# Patient Record
Sex: Male | Born: 1965 | Race: Black or African American | Hispanic: No | Marital: Single | Smoking: Never smoker
Health system: Southern US, Community
[De-identification: ages and names within clinical notes are randomized; demographics above are authoritative.]

## PROBLEM LIST (undated history)

## (undated) DIAGNOSIS — K219 Gastro-esophageal reflux disease without esophagitis: Secondary | ICD-10-CM

## (undated) DIAGNOSIS — E785 Hyperlipidemia, unspecified: Secondary | ICD-10-CM

## (undated) DIAGNOSIS — R51 Headache: Secondary | ICD-10-CM

## (undated) DIAGNOSIS — M1990 Unspecified osteoarthritis, unspecified site: Secondary | ICD-10-CM

## (undated) DIAGNOSIS — R109 Unspecified abdominal pain: Secondary | ICD-10-CM

## (undated) DIAGNOSIS — K5900 Constipation, unspecified: Secondary | ICD-10-CM

## (undated) HISTORY — DX: Unspecified osteoarthritis, unspecified site: M19.90

## (undated) HISTORY — DX: Headache: R51

## (undated) HISTORY — DX: Hyperlipidemia, unspecified: E78.5

## (undated) HISTORY — DX: Unspecified abdominal pain: R10.9

---

## 2003-08-18 ENCOUNTER — Emergency Department (HOSPITAL_COMMUNITY): Admission: EM | Admit: 2003-08-18 | Discharge: 2003-08-18 | Payer: Self-pay | Admitting: Emergency Medicine

## 2003-08-19 ENCOUNTER — Emergency Department (HOSPITAL_COMMUNITY): Admission: EM | Admit: 2003-08-19 | Discharge: 2003-08-19 | Payer: Self-pay | Admitting: Emergency Medicine

## 2003-08-20 ENCOUNTER — Emergency Department (HOSPITAL_COMMUNITY): Admission: EM | Admit: 2003-08-20 | Discharge: 2003-08-20 | Payer: Self-pay | Admitting: Emergency Medicine

## 2003-08-21 ENCOUNTER — Emergency Department (HOSPITAL_COMMUNITY): Admission: EM | Admit: 2003-08-21 | Discharge: 2003-08-21 | Payer: Self-pay | Admitting: Emergency Medicine

## 2011-09-14 LAB — BASIC METABOLIC PANEL
ALT: 19 U/L (ref 10–40)
AST: 26 U/L
Albumin: 4.4
Alkaline Phosphatase: 61 U/L
BUN: 15 mg/dL (ref 4–21)
Total Bilirubin: 0.7 mg/dL

## 2011-09-14 LAB — CBC WITH DIFFERENTIAL/PLATELET
HCT: 41 %
Hemoglobin: 13.2 g/dL — AB (ref 13.5–17.5)
MCV: 89.4 fL
WBC: 5.8
platelet count: 199

## 2011-10-03 ENCOUNTER — Other Ambulatory Visit (HOSPITAL_COMMUNITY): Payer: Self-pay | Admitting: Pulmonary Disease

## 2011-10-05 ENCOUNTER — Ambulatory Visit (HOSPITAL_COMMUNITY)
Admission: RE | Admit: 2011-10-05 | Discharge: 2011-10-05 | Disposition: A | Discharge status: Home / Self Care | Payer: Managed Care, Other (non HMO) | Source: Ambulatory Visit | Attending: Pulmonary Disease | Admitting: Pulmonary Disease

## 2011-10-05 DIAGNOSIS — K5900 Constipation, unspecified: Secondary | ICD-10-CM | POA: Insufficient documentation

## 2011-10-05 DIAGNOSIS — R109 Unspecified abdominal pain: Secondary | ICD-10-CM | POA: Insufficient documentation

## 2011-10-06 ENCOUNTER — Other Ambulatory Visit (HOSPITAL_COMMUNITY): Payer: Self-pay

## 2012-01-30 ENCOUNTER — Encounter: Payer: Self-pay | Admitting: Gastroenterology

## 2012-01-31 ENCOUNTER — Encounter: Payer: Self-pay | Admitting: Gastroenterology

## 2012-01-31 ENCOUNTER — Ambulatory Visit (INDEPENDENT_AMBULATORY_CARE_PROVIDER_SITE_OTHER): Payer: Managed Care, Other (non HMO) | Admitting: Gastroenterology

## 2012-01-31 VITALS — BP 139/72 | HR 65 | Temp 98.0°F | Ht 75.0 in | Wt 173.0 lb

## 2012-01-31 DIAGNOSIS — R194 Change in bowel habit: Secondary | ICD-10-CM

## 2012-01-31 DIAGNOSIS — R198 Other specified symptoms and signs involving the digestive system and abdomen: Secondary | ICD-10-CM

## 2012-01-31 DIAGNOSIS — R1013 Epigastric pain: Secondary | ICD-10-CM | POA: Insufficient documentation

## 2012-01-31 DIAGNOSIS — R1310 Dysphagia, unspecified: Secondary | ICD-10-CM

## 2012-01-31 DIAGNOSIS — R109 Unspecified abdominal pain: Secondary | ICD-10-CM

## 2012-01-31 MED ORDER — DEXLANSOPRAZOLE 60 MG PO CPDR: 60.0000 mg | DELAYED_RELEASE_CAPSULE | Freq: Every day | ORAL | Status: DC

## 2012-01-31 MED ORDER — POLYETHYLENE GLYCOL 3350 17 GM/SCOOP PO POWD: 17.0000 g | Freq: Every day | ORAL | Status: AC | PRN

## 2012-01-31 NOTE — Assessment & Plan Note (Signed)
Change in bowel habits over past several months. Increased constipation. Also with left-sided abdominal pain/epigastric pain, unrelated/unaffected by BMs. C/O epigastric fullness, nausea. Given persistent symptoms, I recommend colonoscopy/egd for further evaluation. He has had reassuring labs, negative H.pylori serologies. EGD/TCS with Dr. Darrick Penna.  I have discussed the risks, alternatives, benefits with regards to but not limited to the risk of reaction to medication, bleeding, infection, perforation and the patient is agreeable to proceed. Written consent to be obtained.  Start Dexilant 60mg  daily. Start Miralax 17g daily prn.

## 2012-01-31 NOTE — Progress Notes (Signed)
Primary Care Physician:  Fredirick Maudlin, MD  Primary Gastroenterologist:  Jonette Eva, MD   Chief Complaint  Patient presents with  . Abdominal Pain  . Constipation  . Nausea    HPI:  Charles [name] is a 46 y.o. male here several month h/o of abdominal pain. Initially started out as constipation, tried fiber with temporary relief. Stomach virus 09/2011, diarrhea, abdominal cramps. Since then, daily abdominal pain. Left sided abdominal pain. Dull pain followed by sharp epigastric pain. No better after BM. Appetite ok, feels full in epigastric/nausea. No heartburn. Long time ago, heartburn. Tried Prevacid 24hr recently, helped for awhile but stopped working. BM used to go 4-5 times per week, now about twice per week. Stools hard in beginning. No brbpr, melena. No unintentional weight loss. No prior EGD/TCS. Denies regular etoh use. No NSAIDS/ASA.   No current outpatient prescriptions on file.    Allergies as of 01/31/2012  . (No Known Allergies)    Past Medical History  Diagnosis Date  . Abdominal pain   . Headache   . Hyperlipidemia     History reviewed. No pertinent past surgical history.  Family History  Problem Relation Age of Onset  . Colon cancer Neg Hx   . Inflammatory bowel disease Neg Hx   . Liver disease Neg Hx     History   Social History  . Marital Status: Single    Spouse Name: N/A    Number of Children: 4  . Years of Education: N/A   Occupational History  . [employer]     Social History Main Topics  . Smoking status: Never Smoker   . Smokeless tobacco: Not on file  . Alcohol Use: Yes     vodka, twice per week, 1 cup in two mixed drinks.   . Drug Use: No  . Sexually Active: Not on file   Other Topics Concern  . Not on file   Social History Narrative  . No narrative on file      ROS:  General: Negative for anorexia, weight loss, fever, chills, fatigue, weakness. Eyes: Negative for vision changes.  ENT: Negative for  hoarseness, difficulty swallowing , nasal congestion. CV: Negative for chest pain, angina, palpitations, dyspnea on exertion, peripheral edema.  Respiratory: Negative for dyspnea at rest, dyspnea on exertion, cough, sputum, wheezing.  GI: See history of present illness. GU:  Negative for dysuria, hematuria, urinary incontinence, urinary frequency, nocturnal urination.  MS: Negative for joint pain, low back pain.  Derm: Negative for rash or itching.  Neuro: Negative for weakness, abnormal sensation, seizure, frequent headaches, memory loss, confusion.  Psych: Negative for anxiety, depression, suicidal ideation, hallucinations.  Endo: Negative for unusual weight change.  Heme: Negative for bruising or bleeding. Allergy: Negative for rash or hives.    Physical Examination:  BP 139/72  Pulse 65  Temp 98 F (36.7 C) (Temporal)  Ht 6\' 3"  (1.905 m)  Wt 173 lb (78.472 kg)  BMI 21.62 kg/m2   General: Well-nourished, well-developed in no acute distress.  Head: Normocephalic, atraumatic.   Eyes: Conjunctiva pink, no icterus. Mouth: Oropharyngeal mucosa moist and pink , no lesions erythema or exudate. Neck: Supple without thyromegaly, masses, or lymphadenopathy.  Lungs: Clear to auscultation bilaterally.  Heart: Regular rate and rhythm, no murmurs rubs or gallops.  Abdomen: Bowel sounds are normal, mild epigastric and left mid abdominal tenderness. Nondistended, no hepatosplenomegaly or masses, no abdominal bruits or    hernia , no rebound or guarding.  Rectal: not performed Extremities: No lower extremity edema. No clubbing or deformities.  Neuro: Alert and oriented x 4 , grossly normal neurologically.  Skin: Warm and dry, no rash or jaundice.   Psych: Alert and cooperative, normal mood and affect.  Labs: Labs from 09/14/2011. White blood cell count 5.8, hemoglobin 13.2, hematocrit 40.5, platelets 199,000, sodium 142, potassium 4.8, BUN 15, creatinine 1.05, calcium 8.5, total bilirubin  0.7, alkaline phosphatase 61, AST 26, ALT 19, albumin 4.4, H. Pylori antibody IgA 1.5, negative.  Imaging Studies:   09/2011: Abd u/s:  IMPRESSION:  1. No gallstones are noted within gallbladder. Normal CBD.  2. Right kidney shows punctate echogenic foci which may represent  vascular calcification or a tiny calcified calculi. No  hydronephrosis.  3. No aortic aneurysm.

## 2012-01-31 NOTE — Patient Instructions (Addendum)
We have scheduled you for a colonoscopy and upper endoscopy with Dr. Darrick Penna. See separate instructions.   Start Dexilant 60mg  daily for abdominal pain. Samples and rebate card provided. If helps you can get prescription filled at your pharmacy.   Start Miralax for constipation. Take one capful daily as needed.

## 2012-01-31 NOTE — Progress Notes (Signed)
Faxed to PCP

## 2012-02-05 NOTE — Progress Notes (Signed)
Quick Note:  Charles [name], can you correct this abstract. Should be H.Pylori IgA is 1.5 NOT HEMOGLOBIN A1C. Thanks.   ______

## 2012-02-26 ENCOUNTER — Other Ambulatory Visit: Payer: Self-pay | Admitting: Gastroenterology

## 2012-02-26 MED ORDER — PEG-KCL-NACL-NASULF-NA ASC-C 100 G PO SOLR: 1.0000 | ORAL | Status: DC

## 2012-02-27 ENCOUNTER — Encounter (HOSPITAL_COMMUNITY): Payer: Self-pay | Admitting: Pharmacy Technician

## 2012-03-04 ENCOUNTER — Other Ambulatory Visit: Payer: Self-pay | Admitting: Gastroenterology

## 2012-03-04 DIAGNOSIS — R1310 Dysphagia, unspecified: Secondary | ICD-10-CM

## 2012-03-04 DIAGNOSIS — R194 Change in bowel habit: Secondary | ICD-10-CM

## 2012-03-04 DIAGNOSIS — R109 Unspecified abdominal pain: Secondary | ICD-10-CM

## 2012-03-04 DIAGNOSIS — R1013 Epigastric pain: Secondary | ICD-10-CM

## 2012-03-04 MED ORDER — SODIUM CHLORIDE 0.45 % IV SOLN
INTRAVENOUS | Status: DC
  Administered 2012-03-05: 1000 mL via INTRAVENOUS

## 2012-03-05 ENCOUNTER — Ambulatory Visit (HOSPITAL_COMMUNITY)
Admission: RE | Admit: 2012-03-05 | Discharge: 2012-03-05 | Disposition: A | Discharge status: Home / Self Care | Payer: Managed Care, Other (non HMO) | Source: Ambulatory Visit | Attending: Gastroenterology | Admitting: Gastroenterology

## 2012-03-05 ENCOUNTER — Encounter (HOSPITAL_COMMUNITY)
Admission: RE | Disposition: A | Discharge status: Home / Self Care | Payer: Self-pay | Source: Ambulatory Visit | Attending: Gastroenterology

## 2012-03-05 ENCOUNTER — Encounter (HOSPITAL_COMMUNITY): Payer: Self-pay | Admitting: *Deleted

## 2012-03-05 DIAGNOSIS — D126 Benign neoplasm of colon, unspecified: Secondary | ICD-10-CM

## 2012-03-05 DIAGNOSIS — K2970 Gastritis, unspecified, without bleeding: Secondary | ICD-10-CM

## 2012-03-05 DIAGNOSIS — R109 Unspecified abdominal pain: Secondary | ICD-10-CM

## 2012-03-05 DIAGNOSIS — K319 Disease of stomach and duodenum, unspecified: Secondary | ICD-10-CM | POA: Insufficient documentation

## 2012-03-05 DIAGNOSIS — K2990 Gastroduodenitis, unspecified, without bleeding: Secondary | ICD-10-CM

## 2012-03-05 DIAGNOSIS — K3189 Other diseases of stomach and duodenum: Secondary | ICD-10-CM | POA: Insufficient documentation

## 2012-03-05 DIAGNOSIS — K620 Anal polyp: Secondary | ICD-10-CM

## 2012-03-05 DIAGNOSIS — R1310 Dysphagia, unspecified: Secondary | ICD-10-CM | POA: Insufficient documentation

## 2012-03-05 DIAGNOSIS — R1013 Epigastric pain: Secondary | ICD-10-CM

## 2012-03-05 DIAGNOSIS — R198 Other specified symptoms and signs involving the digestive system and abdomen: Secondary | ICD-10-CM

## 2012-03-05 DIAGNOSIS — R194 Change in bowel habit: Secondary | ICD-10-CM

## 2012-03-05 DIAGNOSIS — D128 Benign neoplasm of rectum: Secondary | ICD-10-CM | POA: Insufficient documentation

## 2012-03-05 DIAGNOSIS — K621 Rectal polyp: Secondary | ICD-10-CM

## 2012-03-05 DIAGNOSIS — K648 Other hemorrhoids: Secondary | ICD-10-CM | POA: Insufficient documentation

## 2012-03-05 HISTORY — PX: ESOPHAGOGASTRODUODENOSCOPY: SHX1529

## 2012-03-05 HISTORY — DX: Constipation, unspecified: K59.00

## 2012-03-05 HISTORY — DX: Gastro-esophageal reflux disease without esophagitis: K21.9

## 2012-03-05 HISTORY — PX: COLONOSCOPY: SHX174

## 2012-03-05 SURGERY — COLONOSCOPY WITH ESOPHAGOGASTRODUODENOSCOPY (EGD)
Anesthesia: Moderate Sedation

## 2012-03-05 MED ORDER — MINERAL OIL PO OIL
TOPICAL_OIL | ORAL | Status: AC
  Filled 2012-03-05: qty 30

## 2012-03-05 MED ORDER — MEPERIDINE HCL 100 MG/ML IJ SOLN
INTRAMUSCULAR | Status: AC
  Filled 2012-03-05: qty 2

## 2012-03-05 MED ORDER — MIDAZOLAM HCL 5 MG/5ML IJ SOLN
INTRAMUSCULAR | Status: DC | PRN
  Administered 2012-03-05 (×2): 2 mg via INTRAVENOUS
  Administered 2012-03-05: 1 mg via INTRAVENOUS
  Administered 2012-03-05: 2 mg via INTRAVENOUS

## 2012-03-05 MED ORDER — MIDAZOLAM HCL 5 MG/5ML IJ SOLN
INTRAMUSCULAR | Status: AC
  Filled 2012-03-05: qty 10

## 2012-03-05 MED ORDER — BUTAMBEN-TETRACAINE-BENZOCAINE 2-2-14 % EX AERO
INHALATION_SPRAY | CUTANEOUS | Status: DC | PRN
  Administered 2012-03-05: 2 via TOPICAL

## 2012-03-05 MED ORDER — MEPERIDINE HCL 100 MG/ML IJ SOLN
INTRAMUSCULAR | Status: DC | PRN
  Administered 2012-03-05: 25 mg via INTRAVENOUS
  Administered 2012-03-05: 50 mg via INTRAVENOUS
  Administered 2012-03-05: 25 mg via INTRAVENOUS

## 2012-03-05 NOTE — H&P (Signed)
  Primary Care Physician:  Fredirick Maudlin, MD Primary Gastroenterologist:  Dr. Darrick Penna  Pre-Procedure History & Physical: HPI:  Charles [name] is a 46 y.o. male here for CHANGE IN BOWEL HABITS/dysphagia.   Past Medical History  Diagnosis Date  . Abdominal pain   . Headache   . Hyperlipidemia   . Constipation   . GERD (gastroesophageal reflux disease)     History reviewed. No pertinent past surgical history.  Prior to Admission medications   Medication Sig Start Date End Date Taking? Authorizing Provider  dexlansoprazole (DEXILANT) 60 MG capsule Take 1 capsule (60 mg total) by mouth daily. 01/31/12 04/04/12 Yes Tiffany Kocher, PA  polyethylene glycol (MIRALAX / GLYCOLAX) packet Take 17 g by mouth daily.   Yes Historical Provider, MD    Allergies as of 01/31/2012  . (No Known Allergies)    Family History  Problem Relation Age of Onset  . Colon cancer Neg Hx   . Inflammatory bowel disease Neg Hx   . Liver disease Neg Hx     History   Social History  . Marital Status: Single    Spouse Name: N/A    Number of Children: 4  . Years of Education: N/A   Occupational History  . [employer]     Social History Main Topics  . Smoking status: Never Smoker   . Smokeless tobacco: Not on file  . Alcohol Use: 2.5 oz/week    5 drink(s) per week     vodka, twice per week, 1 cup in two mixed drinks.   . Drug Use: No  . Sexually Active: Not on file   Other Topics Concern  . Not on file   Social History Narrative  . No narrative on file    Review of Systems: See HPI, otherwise negative ROS   Physical Exam: BP 144/91  Pulse 69  Temp 98.3 F (36.8 C) (Oral)  Resp 18  Ht 6\' 3"  (1.905 m)  Wt 173 lb (78.472 kg)  BMI 21.62 kg/m2  SpO2 99% General:   Alert,  pleasant and cooperative in NAD Head:  Normocephalic and atraumatic. Neck:  Supple; Lungs:  Clear throughout to auscultation.    Heart:  Regular rate and rhythm. Abdomen:  Soft, nontender and  nondistended. Normal bowel sounds, without guarding, and without rebound.   Neurologic:  Alert and  oriented x4;  grossly normal neurologically.  Impression/Plan:     Change in bowel habits/DYSPHAGIA.   PLAN:  1. TCS/egd/dil TODAY

## 2012-03-05 NOTE — Op Note (Signed)
[facility] [address] [place], [address]   ENDOSCOPY PROCEDURE REPORT  PATIENT: Charles, [name]  MR#: [id number] BIRTHDATE: 1965/12/01 , 46  yrs. old GENDER: Male  ENDOSCOPIST: Jonette Eva, MD REFFERED XB:JYNWGN Juanetta Gosling, M.D.  PROCEDURE DATE:  03/05/2012 PROCEDURE:   EGD with biopsy and EGD with dilatation over guidewire   INDICATIONS:1.  dysphagia.   2.  dyspepsia. MEDICATIONS: TCS+ Versed 1mg  IV TOPICAL ANESTHETIC: Cetacaine Spray  DESCRIPTION OF PROCEDURE:   After the risks benefits and alternatives of the procedure were thoroughly explained, informed consent was obtained.  The EG-[id number] ([id number])  endoscope was introduced through the mouth and advanced to the second portion of the duodenum.  The instrument was slowly withdrawn as the mucosa was carefully examined.  Prior to withdrawal of the scope, the guidwire was placed.  The esophagus was dilated successfully.  The patient was recovered in endoscopy and discharged home in satisfactory condition.      ESOPHAGUS: The mucosa of the esophagus appeared normal.   The mucosa of the esophagus appeared normal.  STOMACH: Non-erosive gastritis (inflammation) was found.  Multiple biopsies were performed using cold forceps.   Dilation was then performed at the gastroesphageal junction  Dilator: Savary over guidewire Size(s): 12.8-16 MM Resistance: minimal  COMPLICATIONS: There were no complications.   ENDOSCOPIC IMPRESSION: 1.   The mucosa of the esophagus appeared normal 2.   The mucosa of the esophagus appeared normal 3.   Non-erosive gastritis (inflammation) was found; multiple biopsies  RECOMMENDATIONS: DRINK WATER TO KEEP URINE LIGHT YELLOW.  MIRALAX EVERY DAY.  CONTINUE DEXILANT EVERY MORNING.  FOLLOW A HIGH FIBER/LOW FAT DIET.  AVOID ITEMS THAT CAUSE BLOATING.   BIOPSY WILL BE BACK IN 7 DAYS.  Follow up in 3 mos.      _______________________________ Gwendlyn Deutscher,  MD 03/05/2012 4:49 PM      PATIENT NAME:  Charles, [name] MR#: [id number]

## 2012-03-05 NOTE — Op Note (Signed)
[facility] [address] [place], [address]   COLONOSCOPY PROCEDURE REPORT  PATIENT: Charles Charles [name] Charles [name], Charles Charles [name] Charles [name]  MR#: [id number] BIRTHDATE: [birth year] , 46  yrs. old GENDER: Male ENDOSCOPIST: Jonette Eva, MD REFERRED XB:JYNWGN Juanetta Gosling, M.D. PROCEDURE DATE:  03/05/2012 PROCEDURE:   Colonoscopy with biopsy INDICATIONS:change in bowel habits. MEDICATIONS: Demerol 100 mg IV and Versed 6 mg IV  DESCRIPTION OF PROCEDURE:    Physical exam was performed.  Informed consent was obtained from the patient after explaining the benefits, risks, and alternatives to procedure.  The patient was connected to monitor and placed in left lateral position. Continuous oxygen was provided by nasal cannula and IV medicine administered through an indwelling cannula.  After administration of sedation and rectal exam, the patients rectum was intubated and the [id number] ([id number])  colonoscope was advanced under direct visualization to the cecum.  The scope was removed slowly by carefully examining the color, texture, anatomy, and integrity mucosa on the way out.  The patient was recovered in endoscopy and discharged home in satisfactory condition.       COLON FINDINGS: Two sessile polyps were found at the cecum and in the rectum.  A polypectomy was performed with cold forceps.  , The colon was otherwise normal.  There was no diverticulosis, inflammation, polyps or cancers unless previously stated.  , Small internal hemorrhoids were found.  , and The mucosa appeared normal in the terminal ileum.  PREP QUALITY: good. CECAL W/D TIME: 18 minutes  COMPLICATIONS: None  ENDOSCOPIC IMPRESSION: 1.   Two sessile polyps were found at the cecum and in the rectum; polypectomy was performed with cold forceps 2.   The colon was otherwise normal 3.   Small internal hemorrhoids 4.   Normal mucosa in the terminal ileum   RECOMMENDATIONS: DRINK WATER TO KEEP URINE LIGHT YELLOW.  MIRALAX EVERY  DAY.  FOLLOW A HIGH FIBER/LOW FAT DIET.  AVOID ITEMS THAT CAUSE BLOATING. SEE INFO BELOW.  BIOPSY WILL BE BACK IN 7 DAYS.  Next colonoscopy in 10 years.       _______________________________ Charles DoctorJonette Eva, MD 03/05/2012 4:46 PM     PATIENT NAME:  Charles Charles [name], Charles [name] MR#: [id number]

## 2012-03-07 ENCOUNTER — Telehealth: Payer: Self-pay | Admitting: Gastroenterology

## 2012-03-07 NOTE — Telephone Encounter (Signed)
LMOM to call.

## 2012-03-07 NOTE — Telephone Encounter (Signed)
Faxed to PCP, recall made  

## 2012-03-07 NOTE — Telephone Encounter (Signed)
PLEASE CALL PT.  He had HYPERPLASTIC POLYPS removed from her colon. His stomach Bx shows gastritis. Continue Dexilant. Miralax daily. Follow a low fat/high fiber diet. OPV IN 3 mos E30 GERD, CONSTIPATION. Next TCS in 10 years.

## 2012-03-08 NOTE — Telephone Encounter (Signed)
Pt is aware of results. 

## 2012-03-14 NOTE — Progress Notes (Signed)
TCS OCT 2013 hyperplastic polyps  EGD OCT 2013 GASTRITIS  REVIEWED.

## 2012-05-10 ENCOUNTER — Encounter: Payer: Self-pay | Admitting: *Deleted

## 2012-05-16 ENCOUNTER — Other Ambulatory Visit: Payer: Self-pay

## 2012-05-16 MED ORDER — DEXLANSOPRAZOLE 60 MG PO CPDR: 60.0000 mg | DELAYED_RELEASE_CAPSULE | Freq: Every day | ORAL | Status: DC

## 2012-09-04 ENCOUNTER — Telehealth: Payer: Self-pay

## 2012-09-04 MED ORDER — OMEPRAZOLE 20 MG PO CPDR: 20.0000 mg | DELAYED_RELEASE_CAPSULE | Freq: Every day | ORAL | Status: DC

## 2012-09-04 NOTE — Telephone Encounter (Signed)
Received PA request from pharmacy for Dexilant. Per [employer] formulary, pt will need to try omeprazole and pantoprazole. Pt has only tried prevacid.

## 2012-09-04 NOTE — Telephone Encounter (Signed)
Tried to call pt- Charles [name] with details. Asked him to call back if he had any questions.

## 2012-09-04 NOTE — Telephone Encounter (Signed)
Prilosec sent to pharmacy.  Contact us in 1 month with PR>.

## 2012-09-10 ENCOUNTER — Telehealth: Payer: Self-pay | Admitting: Gastroenterology

## 2012-09-10 MED ORDER — PANTOPRAZOLE SODIUM 40 MG PO TBEC: 40.0000 mg | DELAYED_RELEASE_TABLET | Freq: Every day | ORAL | Status: DC

## 2012-09-10 NOTE — Addendum Note (Signed)
Addended by: Tiffany Kocher on: 09/10/2012 01:09 PM   Modules accepted: Orders, Medications

## 2012-09-10 NOTE — Telephone Encounter (Signed)
Patient called back and I told him new Rx has been sent to the pharmacy and he made a f/u appointment for 09/26/12

## 2012-09-10 NOTE — Telephone Encounter (Signed)
He can try pantoprazole. Stop omeprazole since he likely has side effects.  RX sent to pharmacy. Schedule f/u ov as per previously recommended

## 2012-09-10 NOTE — Telephone Encounter (Signed)
Patient called to speak with nurse regarding side effects of medication. [phone]

## 2012-09-10 NOTE — Telephone Encounter (Signed)
Called. Many rings and no answer.  

## 2012-09-10 NOTE — Telephone Encounter (Signed)
I called pt. He said the next day after he started taking the Omeprazole he got diarrhea and nausea. It has continued and he said his stools also look dark. I told him to stop until I call him back. He forgot about his follow up appt and is not scheduled for recheck. Please advise!

## 2012-09-24 ENCOUNTER — Encounter: Payer: Self-pay | Admitting: Gastroenterology

## 2012-09-26 ENCOUNTER — Ambulatory Visit: Payer: Managed Care, Other (non HMO) | Admitting: Gastroenterology

## 2012-10-09 ENCOUNTER — Ambulatory Visit (INDEPENDENT_AMBULATORY_CARE_PROVIDER_SITE_OTHER): Payer: Managed Care, Other (non HMO) | Admitting: Gastroenterology

## 2012-10-09 ENCOUNTER — Encounter: Payer: Self-pay | Admitting: Gastroenterology

## 2012-10-09 VITALS — BP 130/78 | HR 76 | Temp 97.4°F | Ht 74.0 in | Wt 178.8 lb

## 2012-10-09 DIAGNOSIS — R1013 Epigastric pain: Secondary | ICD-10-CM

## 2012-10-09 MED ORDER — DEXLANSOPRAZOLE 60 MG PO CPDR: 60.0000 mg | DELAYED_RELEASE_CAPSULE | Freq: Every day | ORAL | Status: DC

## 2012-10-09 MED ORDER — POLYETHYLENE GLYCOL 3350 17 GM/SCOOP PO POWD: 17.0000 g | Freq: Every day | ORAL | Status: DC

## 2012-10-09 NOTE — Patient Instructions (Addendum)
Restart Dexilant, we have provided samples. We will need to approve this with your insurance company.   Return in 6 months. If your symptoms continue, please call us and we will further evaluate your gallbladder with a HIDA scan.

## 2012-10-09 NOTE — Assessment & Plan Note (Addendum)
47 year old male s/p EGD and colonoscopy as described above. Significantly improved symptoms with Dexilant; he has tried and failed both Prilosec and Protonix. I have resent Dexilant prescription, with most likely a prior authorization required. Samples provided in the interim. I have asked him to call if no improvement, as we may need to rule out underlying biliary dyskinesia by pursuing a HIDA scan. This is less likely. Constipation rare, and he does well with Miralax prn. This has also been sent to the pharmacy.  Dexilant daily Miralax prn: declined prescription medication such as Linzess Return in 6 mos HIDA if no improvement in symptoms Next TCS in 2023

## 2012-10-09 NOTE — Progress Notes (Signed)
   Referring Provider: Fredirick Maudlin, MD Primary Care Physician:  Fredirick Maudlin, MD Primary GI: Dr. Darrick Penna   Chief Complaint  Patient presents with  . Follow-up    HPI:   47 year old male presenting today in follow-up after EGD and colonoscopy by Dr. Darrick Penna. Gastritis with empiric Savary dilation performed, negative h.pylori. Colonoscopy with hyperplastic polyps.   Was on Dexilant, due to insurance had to try preferred PPIs firs. Changed to Omeprazole and then Protonix. Dexilant did well with abdominal discomfort. Both Omeprazole and Protonix caused abdominal cramps, N/V, migraines. Stopped Protonix early May. Has upper abdominal, above umbilical area abdominal discomfort occasionally. Wonders if related to eating, certain types of foods. Intermittent. "not bad". Occasional nausea. Like a dull discomfort. Occasional constipation. Was taking Miralax prn.   Past Medical History  Diagnosis Date  . Abdominal pain   . Headache   . Hyperlipidemia   . Constipation   . GERD (gastroesophageal reflux disease)     Past Surgical History  Procedure Laterality Date  . Colonoscopy  03/05/2012    ZOX:WRUEA internal hemorrhoids/Two sessile polyps HYPERPLASTIC  . Esophagogastroduodenoscopy  03/05/2012    SLF:Non-erosive gastritis (inflammation), negative H.pylori, s/p empiric Savary dilation    Current Outpatient Prescriptions  Medication Sig Dispense Refill  . dexlansoprazole (DEXILANT) 60 MG capsule Take 1 capsule (60 mg total) by mouth daily.  30 capsule  3  . polyethylene glycol powder (MIRALAX) powder Take 17 g by mouth daily.  255 g  5   No current facility-administered medications for this visit.    Allergies as of 10/09/2012  . (No Known Allergies)    Family History  Problem Relation Age of Onset  . Colon cancer Neg Hx   . Inflammatory bowel disease Neg Hx   . Liver disease Neg Hx     History   Social History  . Marital Status: Single    Spouse Name: N/A    Number  of Children: 4  . Years of Education: N/A   Occupational History  . [employer]     Social History Main Topics  . Smoking status: Never Smoker   . Smokeless tobacco: None  . Alcohol Use: 2.5 oz/week    5 drink(s) per week     Comment: vodka, twice per week, 1 cup in two mixed drinks.   . Drug Use: No  . Sexually Active: None   Other Topics Concern  . None   Social History Narrative  . None    Review of Systems: Negative unless noted in HPI.  Physical Exam: BP 130/78  Pulse 76  Temp(Src) 97.4 F (36.3 C) (Oral)  Ht 6\' 2"  (1.88 m)  Wt 178 lb 12.8 oz (81.103 kg)  BMI 22.95 kg/m2 General:   Alert and oriented. No distress noted. Pleasant and cooperative.  Head:  Normocephalic and atraumatic. Eyes:  Conjuctiva clear without scleral icterus. Heart:  S1, S2 present without murmurs, rubs, or gallops. Regular rate and rhythm. Abdomen:  +BS, soft, non-tender and non-distended. No rebound or guarding. No HSM or masses noted. Msk:  Symmetrical without gross deformities. Normal posture. Extremities:  Without edema. Neurologic:  Alert and  oriented x4;  grossly normal neurologically. Skin:  Intact without significant lesions or rashes. Psych:  Alert and cooperative. Normal mood and affect.

## 2012-10-09 NOTE — Progress Notes (Signed)
Cc PCP 

## 2012-10-18 ENCOUNTER — Other Ambulatory Visit: Payer: Self-pay

## 2012-10-18 MED ORDER — DEXLANSOPRAZOLE 60 MG PO CPDR: 60.0000 mg | DELAYED_RELEASE_CAPSULE | Freq: Every day | ORAL | Status: DC

## 2012-10-30 ENCOUNTER — Other Ambulatory Visit (HOSPITAL_COMMUNITY): Payer: Self-pay | Admitting: Pulmonary Disease

## 2012-10-30 DIAGNOSIS — R109 Unspecified abdominal pain: Secondary | ICD-10-CM

## 2012-11-07 ENCOUNTER — Encounter (HOSPITAL_COMMUNITY): Payer: Self-pay

## 2012-11-07 ENCOUNTER — Encounter (HOSPITAL_COMMUNITY)
Admission: RE | Admit: 2012-11-07 | Discharge: 2012-11-07 | Disposition: A | Discharge status: Home / Self Care | Payer: Managed Care, Other (non HMO) | Source: Ambulatory Visit | Attending: Pulmonary Disease | Admitting: Pulmonary Disease

## 2012-11-07 DIAGNOSIS — R109 Unspecified abdominal pain: Secondary | ICD-10-CM | POA: Insufficient documentation

## 2012-11-07 MED ORDER — TECHNETIUM TC 99M MEBROFENIN IV KIT
5.0000 | PACK | Freq: Once | INTRAVENOUS | Status: AC | PRN
  Administered 2012-11-07: 5 via INTRAVENOUS

## 2012-11-07 MED ORDER — SINCALIDE 5 MCG IJ SOLR
0.0200 ug/kg | Freq: Once | INTRAMUSCULAR | Status: AC
  Administered 2012-11-07: 1.59 ug via INTRAVENOUS

## 2013-04-09 ENCOUNTER — Encounter: Payer: Self-pay | Admitting: Gastroenterology

## 2013-04-26 NOTE — Progress Notes (Signed)
REVIEWED.  

## 2013-05-06 NOTE — Patient Instructions (Signed)
Charles [name]  05/06/2013   Your procedure is scheduled on:  05/09/2013  Report to [facility] at  730  AM.  Call this number if you have problems the morning of surgery: [phone]   Remember:   Do not eat food or drink liquids after midnight.   Take these medicines the morning of surgery with A SIP OF WATER:  dexilant   Do not wear jewelry, make-up or nail polish.  Do not wear lotions, powders, or perfumes.   Do not shave 48 hours prior to surgery. Men may shave face and neck.  Do not bring valuables to the hospital.  [facility] is not responsible for any belongings or valuables.               Contacts, dentures or bridgework may not be worn into surgery.  Leave suitcase in the car. After surgery it may be brought to your room.  For patients admitted to the hospital, discharge time is determined by your treatment team.               Patients discharged the day of surgery will not be allowed to drive home.  Name and phone number of your driver: family  Special Instructions: Shower using CHG 2 nights before surgery and the night before surgery.  If you shower the day of surgery use CHG.  Use special wash - you have one bottle of CHG for all showers.  You should use approximately 1/3 of the bottle for each shower.   Please read over the following fact sheets that you were given: Pain Booklet, Coughing and Deep Breathing, Surgical Site Infection Prevention, Anesthesia Post-op Instructions and Care and Recovery After Surgery Hernia A hernia occurs when an internal organ pushes out through a weak spot in the abdominal wall. Hernias most commonly occur in the groin and around the navel. Hernias often can be pushed back into place (reduced). Most hernias tend to get worse over time. Some abdominal hernias can get stuck in the opening (irreducible or incarcerated hernia) and cannot be reduced. An irreducible abdominal hernia which is tightly squeezed into the opening is at risk for  impaired blood supply (strangulated hernia). A strangulated hernia is a medical emergency. Because of the risk for an irreducible or strangulated hernia, surgery may be recommended to repair a hernia. CAUSES   Heavy lifting.  Prolonged coughing.  Straining to have a bowel movement.  A cut (incision) made during an abdominal surgery. HOME CARE INSTRUCTIONS   Bed rest is not required. You may continue your normal activities.  Avoid lifting more than 10 pounds (4.5 kg) or straining.  Cough gently. If you are a smoker it is best to stop. Even the best hernia repair can break down with the continual strain of coughing. Even if you do not have your hernia repaired, a cough will continue to aggravate the problem.  Do not wear anything tight over your hernia. Do not try to keep it in with an outside bandage or truss. These can damage abdominal contents if they are trapped within the hernia sac.  Eat a normal diet.  Avoid constipation. Straining over long periods of time will increase hernia size and encourage breakdown of repairs. If you cannot do this with diet alone, stool softeners may be used. SEEK IMMEDIATE MEDICAL CARE IF:   You have a fever.  You develop increasing abdominal pain.  You feel nauseous or vomit.  Your hernia is stuck  outside the abdomen, looks discolored, feels hard, or is tender.  You have any changes in your bowel habits or in the hernia that are unusual for you.  You have increased pain or swelling around the hernia.  You cannot push the hernia back in place by applying gentle pressure while lying down. MAKE SURE YOU:   Understand these instructions.  Will watch your condition.  Will get help right away if you are not doing well or get worse. Document Released: 05/15/2005 Document Revised: 08/07/2011 Document Reviewed: 01/02/2008 [facility] Patient Information 2014 [name] PATIENT INSTRUCTIONS POST-ANESTHESIA  IMMEDIATELY FOLLOWING SURGERY:  Do  not drive or operate machinery for the first twenty four hours after surgery.  Do not make any important decisions for twenty four hours after surgery or while taking narcotic pain medications or sedatives.  If you develop intractable nausea and vomiting or a severe headache please notify your doctor immediately.  FOLLOW-UP:  Please make an appointment with your surgeon as instructed. You do not need to follow up with anesthesia unless specifically instructed to do so.  WOUND CARE INSTRUCTIONS (if applicable):  Keep a dry clean dressing on the anesthesia/puncture wound site if there is drainage.  Once the wound has quit draining you may leave it open to air.  Generally you should leave the bandage intact for twenty four hours unless there is drainage.  If the epidural site drains for more than 36-48 hours please call the anesthesia department.  QUESTIONS?:  Please feel free to call your physician or the hospital operator if you have any questions, and they will be happy to assist you.

## 2013-05-06 NOTE — H&P (Signed)
NTS SOAP Note  Vital Signs:  Vitals as of: 05/05/2013: Systolic 147: Diastolic 99: Heart Rate 66: Temp 98.5F: Height 74ft 3in: Weight 178Lbs 0 Ounces: Pain Level 7: BMI 22.25  BMI : 22.25 kg/m2  Subjective: This 47 Years old Male presents for of  patient presents with right groin discomfort and intermittent bulging. He states he has noted this over the last month or so. It has slowly increased in size. In he states it is always gone in the morning but occurs after straining or after and in a standing position for while. No signs or symptoms of incarceration or strangulation on discussion. No similar symptomatology in the past.  Review of Symptoms:  Constitutional:unremarkable   Head:unremarkable    Eyes:unremarkable   Nose/Mouth/Throat:unremarkable Cardiovascular:  unremarkable   Respiratory:unremarkable        as per history of present illness Genitourinary:unremarkable     Musculoskeletal:unremarkable   Skin:unremarkable Breast:unremarkable   Hematolgic/Lymphatic:unremarkable     Allergic/Immunologic:unremarkable     Past Medical History:  Obtained     Past Medical History  Surgical History: none Medical Problems: none Allergies: no known drug allergies Medications: none   Social History:Obtained  Social History  Preferred Language: English Race:  Black or African American Ethnicity: Not Hispanic / Latino Age: 43 Years 11 Months Marital Status:  S Alcohol: occasional Recreational drug(s): none   Smoking Status: Never smoker reviewed on 11/27/2012 Functional Status reviewed on mm/dd/yyyy ------------------------------------------------ Bathing: Normal Cooking: Normal Dressing: Normal Driving: Normal Eating: Normal Managing Meds: Normal Oral Care: Normal Shopping: Normal Toileting: Normal Transferring: Normal Walking: Normal Cognitive Status reviewed on  mm/dd/yyyy ------------------------------------------------ Attention: Normal Decision Making: Normal Language: Normal Memory: Normal Motor: Normal Perception: Normal Problem Solving: Normal Visual and Spatial: Normal   Family History:Obtained    Family Health History Mother  Father  Other Family Member, Living; Healthy; diabetes mellitus    Objective Information: General:  Well appearing, well nourished in no distress. Skin:     no rash or prominent lesions Head:Atraumatic; no masses; no abnormalities Eyes:  conjunctiva clear, EOM intact, PERRL Mouth:  Mucous membranes moist, no mucosal lesions. Throat:  no erythema, exudates or lesions. Neck:  Supple without lymphadenopathy.  Heart:  RRR, no murmur Lungs:    CTA bilaterally, no wheezes, rhonchi, rales.  Breathing unlabored. Abdomen:Soft, NT/ND, no HSM, no masses.   Small umbilical hernia. Laxity noted in the left inguinal region, right inguinal hernia easily reducible.  Assessment:Right inguinal hernia    Plan:  Right inguinal hernia. Options were discussed with patient. Signs and symptoms of incarceration and strangulation were also discussed. Patient is aware to proceed to the emergency department should any signs or symptoms occur. At this time patient will consider his options. He does not wish to proceed with this formal surgical repair at this time but will call should he decide to proceed.  Patient Education:Alternative treatments to surgery were discussed with patient (and family).  Risks and benefits  of procedure were fully explained to the patient (and family) who gave informed consent. Patient/family questions were addressed.

## 2013-05-07 ENCOUNTER — Encounter (HOSPITAL_COMMUNITY)
Admission: RE | Admit: 2013-05-07 | Discharge: 2013-05-07 | Disposition: A | Discharge status: Home / Self Care | Payer: Managed Care, Other (non HMO) | Source: Ambulatory Visit | Attending: General Surgery | Admitting: General Surgery

## 2013-05-07 ENCOUNTER — Encounter (HOSPITAL_COMMUNITY): Payer: Self-pay

## 2013-05-07 ENCOUNTER — Encounter (HOSPITAL_COMMUNITY): Payer: Self-pay | Admitting: Pharmacy Technician

## 2013-05-07 LAB — CBC WITH DIFFERENTIAL/PLATELET
Eosinophils Relative: 3 % (ref 0–5)
HCT: 37.7 % — ABNORMAL LOW (ref 39.0–52.0)
Lymphocytes Relative: 30 % (ref 12–46)
Lymphs Abs: 1.1 10*3/uL (ref 0.7–4.0)
MCV: 87.7 fL (ref 78.0–100.0)
Monocytes Absolute: 0.4 10*3/uL (ref 0.1–1.0)
Monocytes Relative: 11 % (ref 3–12)
RBC: 4.3 MIL/uL (ref 4.22–5.81)
WBC: 3.8 10*3/uL — ABNORMAL LOW (ref 4.0–10.5)

## 2013-05-07 LAB — BASIC METABOLIC PANEL
CO2: 30 mEq/L (ref 19–32)
Chloride: 102 mEq/L (ref 96–112)
Sodium: 140 mEq/L (ref 135–145)

## 2013-05-08 ENCOUNTER — Encounter (HOSPITAL_COMMUNITY): Payer: Self-pay | Admitting: Pharmacy Technician

## 2013-05-09 ENCOUNTER — Encounter (HOSPITAL_COMMUNITY): Payer: Self-pay | Admitting: *Deleted

## 2013-05-09 ENCOUNTER — Ambulatory Visit (HOSPITAL_COMMUNITY)
Admission: RE | Admit: 2013-05-09 | Discharge: 2013-05-09 | Disposition: A | Discharge status: Home / Self Care | Payer: Managed Care, Other (non HMO) | Source: Ambulatory Visit | Attending: General Surgery | Admitting: General Surgery

## 2013-05-09 ENCOUNTER — Ambulatory Visit (HOSPITAL_COMMUNITY): Payer: Managed Care, Other (non HMO) | Admitting: Anesthesiology

## 2013-05-09 ENCOUNTER — Encounter (HOSPITAL_COMMUNITY)
Admission: RE | Disposition: A | Discharge status: Home / Self Care | Payer: Managed Care, Other (non HMO) | Source: Ambulatory Visit | Attending: General Surgery

## 2013-05-09 ENCOUNTER — Encounter (HOSPITAL_COMMUNITY): Payer: Managed Care, Other (non HMO) | Admitting: Anesthesiology

## 2013-05-09 DIAGNOSIS — K4090 Unilateral inguinal hernia, without obstruction or gangrene, not specified as recurrent: Secondary | ICD-10-CM | POA: Insufficient documentation

## 2013-05-09 HISTORY — PX: INGUINAL HERNIA REPAIR: SHX194

## 2013-05-09 SURGERY — REPAIR, HERNIA, INGUINAL, ADULT
Anesthesia: General | Site: Abdomen | Laterality: Right

## 2013-05-09 MED ORDER — BUPIVACAINE HCL (PF) 0.5 % IJ SOLN
INTRAMUSCULAR | Status: AC
  Filled 2013-05-09: qty 30

## 2013-05-09 MED ORDER — KETOROLAC TROMETHAMINE 30 MG/ML IJ SOLN
INTRAMUSCULAR | Status: AC
  Filled 2013-05-09: qty 1

## 2013-05-09 MED ORDER — ROCURONIUM BROMIDE 100 MG/10ML IV SOLN
INTRAVENOUS | Status: DC | PRN
  Administered 2013-05-09: 5 mg via INTRAVENOUS

## 2013-05-09 MED ORDER — SODIUM CHLORIDE 0.9 % IR SOLN
Status: DC | PRN
  Administered 2013-05-09: 1000 mL

## 2013-05-09 MED ORDER — FENTANYL CITRATE 0.05 MG/ML IJ SOLN
INTRAMUSCULAR | Status: AC
  Filled 2013-05-09: qty 2

## 2013-05-09 MED ORDER — FENTANYL CITRATE 0.05 MG/ML IJ SOLN
INTRAMUSCULAR | Status: AC
  Filled 2013-05-09: qty 5

## 2013-05-09 MED ORDER — FENTANYL CITRATE 0.05 MG/ML IJ SOLN: 25.0000 ug | INTRAMUSCULAR | Status: DC | PRN

## 2013-05-09 MED ORDER — OXYCODONE-ACETAMINOPHEN 7.5-325 MG PO TABS: 1.0000 | ORAL_TABLET | ORAL | Status: DC | PRN

## 2013-05-09 MED ORDER — EPHEDRINE SULFATE 50 MG/ML IJ SOLN
INTRAMUSCULAR | Status: DC | PRN
  Administered 2013-05-09: 10 mg via INTRAVENOUS

## 2013-05-09 MED ORDER — PROPOFOL 10 MG/ML IV EMUL
INTRAVENOUS | Status: AC
  Filled 2013-05-09: qty 20

## 2013-05-09 MED ORDER — GLYCOPYRROLATE 0.2 MG/ML IJ SOLN
INTRAMUSCULAR | Status: DC | PRN
  Administered 2013-05-09: .4 mg via INTRAVENOUS

## 2013-05-09 MED ORDER — LIDOCAINE HCL 1 % IJ SOLN
INTRAMUSCULAR | Status: DC | PRN
  Administered 2013-05-09: 30 mg via INTRADERMAL

## 2013-05-09 MED ORDER — BUPIVACAINE LIPOSOME 1.3 % IJ SUSP
INTRAMUSCULAR | Status: DC | PRN
  Administered 2013-05-09: 8 mL

## 2013-05-09 MED ORDER — MIDAZOLAM HCL 2 MG/2ML IJ SOLN
INTRAMUSCULAR | Status: AC
  Filled 2013-05-09: qty 2

## 2013-05-09 MED ORDER — CEFAZOLIN SODIUM-DEXTROSE 2-3 GM-% IV SOLR
2.0000 g | INTRAVENOUS | Status: AC
  Administered 2013-05-09: 2 g via INTRAVENOUS
  Filled 2013-05-09: qty 50

## 2013-05-09 MED ORDER — NEOSTIGMINE METHYLSULFATE 1 MG/ML IJ SOLN
INTRAMUSCULAR | Status: DC | PRN
  Administered 2013-05-09: 2 mg via INTRAVENOUS

## 2013-05-09 MED ORDER — LIDOCAINE HCL (PF) 1 % IJ SOLN
INTRAMUSCULAR | Status: AC
  Filled 2013-05-09: qty 5

## 2013-05-09 MED ORDER — PROPOFOL 10 MG/ML IV BOLUS
INTRAVENOUS | Status: DC | PRN
  Administered 2013-05-09: 35 mg via INTRAVENOUS
  Administered 2013-05-09: 175 mg via INTRAVENOUS

## 2013-05-09 MED ORDER — BUPIVACAINE LIPOSOME 1.3 % IJ SUSP
20.0000 mL | Freq: Once | INTRAMUSCULAR | Status: DC
  Filled 2013-05-09: qty 20

## 2013-05-09 MED ORDER — ONDANSETRON HCL 4 MG/2ML IJ SOLN: 4.0000 mg | Freq: Once | INTRAMUSCULAR | Status: AC | PRN

## 2013-05-09 MED ORDER — ROCURONIUM BROMIDE 50 MG/5ML IV SOLN
INTRAVENOUS | Status: AC
  Filled 2013-05-09: qty 1

## 2013-05-09 MED ORDER — FENTANYL CITRATE 0.05 MG/ML IJ SOLN
INTRAMUSCULAR | Status: DC | PRN
  Administered 2013-05-09 (×4): 50 ug via INTRAVENOUS

## 2013-05-09 MED ORDER — ONDANSETRON HCL 4 MG/2ML IJ SOLN
4.0000 mg | Freq: Once | INTRAMUSCULAR | Status: AC
  Administered 2013-05-09: 4 mg via INTRAVENOUS

## 2013-05-09 MED ORDER — GLYCOPYRROLATE 0.2 MG/ML IJ SOLN
INTRAMUSCULAR | Status: AC
  Filled 2013-05-09: qty 1

## 2013-05-09 MED ORDER — KETOROLAC TROMETHAMINE 30 MG/ML IJ SOLN
30.0000 mg | Freq: Once | INTRAMUSCULAR | Status: AC
  Administered 2013-05-09: 30 mg via INTRAVENOUS

## 2013-05-09 MED ORDER — CHLORHEXIDINE GLUCONATE 4 % EX LIQD: 1.0000 "application " | Freq: Once | CUTANEOUS | Status: DC

## 2013-05-09 MED ORDER — ONDANSETRON HCL 4 MG/2ML IJ SOLN
INTRAMUSCULAR | Status: AC
  Filled 2013-05-09: qty 2

## 2013-05-09 MED ORDER — LACTATED RINGERS IV SOLN
INTRAVENOUS | Status: DC
  Administered 2013-05-09: 10:00:00 via INTRAVENOUS
  Administered 2013-05-09: 1000 mL via INTRAVENOUS

## 2013-05-09 MED ORDER — MIDAZOLAM HCL 2 MG/2ML IJ SOLN
1.0000 mg | INTRAMUSCULAR | Status: AC | PRN
  Administered 2013-05-09 (×3): 2 mg via INTRAVENOUS
  Filled 2013-05-09: qty 2

## 2013-05-09 MED FILL — Bupivacaine HCl Preservative Free (PF) Inj 0.5%: INTRAMUSCULAR | Qty: 30 | Status: AC

## 2013-05-09 SURGICAL SUPPLY — 43 items
BAG HAMPER (MISCELLANEOUS) ×2 IMPLANT
BUPIVICAINE 0.5% 30 ML IMPLANT
CLOTH BEACON ORANGE TIMEOUT ST (SAFETY) ×2 IMPLANT
COVER LIGHT HANDLE STERIS (MISCELLANEOUS) ×4 IMPLANT
DECANTER SPIKE VIAL GLASS SM (MISCELLANEOUS) ×2 IMPLANT
DERMABOND ADVANCED (GAUZE/BANDAGES/DRESSINGS) ×1
DERMABOND ADVANCED .7 DNX12 (GAUZE/BANDAGES/DRESSINGS) ×1 IMPLANT
DRAIN PENROSE 3/4X12 (DRAIN) ×2 IMPLANT
ELECT REM PT RETURN 9FT ADLT (ELECTROSURGICAL) ×2
ELECTRODE REM PT RTRN 9FT ADLT (ELECTROSURGICAL) ×1 IMPLANT
FORMALIN 10 PREFIL 120ML (MISCELLANEOUS) IMPLANT
GLOVE BIOGEL PI IND STRL 7.0 (GLOVE) ×1 IMPLANT
GLOVE BIOGEL PI IND STRL 7.5 (GLOVE) ×1 IMPLANT
GLOVE BIOGEL PI IND STRL 8 (GLOVE) ×2 IMPLANT
GLOVE BIOGEL PI INDICATOR 7.0 (GLOVE) ×1
GLOVE BIOGEL PI INDICATOR 7.5 (GLOVE) ×1
GLOVE BIOGEL PI INDICATOR 8 (GLOVE) ×2
GLOVE ECLIPSE 6.5 STRL STRAW (GLOVE) ×2 IMPLANT
GLOVE ECLIPSE 7.0 STRL STRAW (GLOVE) ×2 IMPLANT
GLOVE ECLIPSE 7.5 STRL STRAW (GLOVE) ×2 IMPLANT
GOWN STRL REIN XL XLG (GOWN DISPOSABLE) ×6 IMPLANT
INST SET MINOR GENERAL (KITS) ×2 IMPLANT
KIT ROOM TURNOVER APOR (KITS) ×2 IMPLANT
MANIFOLD NEPTUNE II (INSTRUMENTS) ×2 IMPLANT
MESH MARLEX PLUG MEDIUM (Mesh General) ×2 IMPLANT
NEEDLE HYPO 18GX1.5 BLUNT FILL (NEEDLE) ×2 IMPLANT
NS IRRIG 1000ML POUR BTL (IV SOLUTION) ×2 IMPLANT
PACK MINOR (CUSTOM PROCEDURE TRAY) ×2 IMPLANT
PAD ARMBOARD 7.5X6 YLW CONV (MISCELLANEOUS) ×2 IMPLANT
SET BASIN LINEN APH (SET/KITS/TRAYS/PACK) ×2 IMPLANT
SOL PREP PROV IODINE SCRUB 4OZ (MISCELLANEOUS) ×2 IMPLANT
SUT NOVA NAB GS-22 2 2-0 T-19 (SUTURE) ×4 IMPLANT
SUT NOVAFIL NAB HGS22 2-0 30IN (SUTURE) ×2 IMPLANT
SUT SILK 3 0 (SUTURE)
SUT SILK 3-0 18XBRD TIE 12 (SUTURE) IMPLANT
SUT VIC AB 2-0 CT1 27 (SUTURE) ×1
SUT VIC AB 2-0 CT1 TAPERPNT 27 (SUTURE) ×1 IMPLANT
SUT VIC AB 3-0 SH 27 (SUTURE) ×1
SUT VIC AB 3-0 SH 27X BRD (SUTURE) ×1 IMPLANT
SUT VIC AB 4-0 PS2 27 (SUTURE) ×2 IMPLANT
SUT VICRYL AB 3 0 TIES (SUTURE) IMPLANT
SYR CONTROL 10ML LL (SYRINGE) ×2 IMPLANT
TOWEL OR 17X26 4PK STRL BLUE (TOWEL DISPOSABLE) ×2 IMPLANT

## 2013-05-09 NOTE — Anesthesia Postprocedure Evaluation (Signed)
  Anesthesia Post-op Note  Patient: Charles [name]  Procedure(s) Performed: Procedure(s): HERNIA REPAIR INGUINAL ADULT (Right)  Patient Location: PACU  Anesthesia Type:General  Level of Consciousness: awake  Airway and Oxygen Therapy: Patient Spontanous Breathing and Patient connected to face mask oxygen  Post-op Pain: mild  Post-op Assessment: Post-op Vital signs reviewed, Patient's Cardiovascular Status Stable, Respiratory Function Stable, Patent Airway and No signs of Nausea or vomiting  Post-op Vital Signs: Reviewed and stable  Complications: No apparent anesthesia complications

## 2013-05-09 NOTE — Transfer of Care (Signed)
Immediate Anesthesia Transfer of Care Note  Patient: Charles [name]  Procedure(s) Performed: Procedure(s): HERNIA REPAIR INGUINAL ADULT (Right)  Patient Location: PACU  Anesthesia Type:General  Level of Consciousness: awake  Airway & Oxygen Therapy: Patient Spontanous Breathing and Patient connected to face mask oxygen  Post-op Assessment: Report given to PACU RN  Post vital signs: Reviewed and stable  Complications: No apparent anesthesia complications

## 2013-05-09 NOTE — Anesthesia Procedure Notes (Signed)
Procedure Name: Intubation Date/Time: 05/09/2013 9:27 AM Performed by: Glynn Octave E Pre-anesthesia Checklist: Patient identified, Patient being monitored, Timeout performed, Emergency Drugs available and Suction available Patient Re-evaluated:Patient Re-evaluated prior to inductionOxygen Delivery Method: Circle System Utilized Preoxygenation: Pre-oxygenation with 100% oxygen Intubation Type: IV induction, Rapid sequence and Cricoid Pressure applied Ventilation: Mask ventilation without difficulty Laryngoscope Size: Mac and 3 Grade View: Grade I Tube type: Oral Tube size: 7.0 mm Number of attempts: 1 Airway Equipment and Method: stylet Placement Confirmation: ETT inserted through vocal cords under direct vision,  positive ETCO2 and breath sounds checked- equal and bilateral Secured at: 21 cm Tube secured with: Tape Dental Injury: Teeth and Oropharynx as per pre-operative assessment

## 2013-05-09 NOTE — Interval H&P Note (Signed)
History and Physical Interval Note:  05/09/2013 8:21 AM  Charles [name]  has presented today for surgery, with the diagnosis of right inguinal hernia  The various methods of treatment have been discussed with the patient and family. After consideration of risks, benefits and other options for treatment, the patient has consented to  Procedure(s): HERNIA REPAIR INGUINAL ADULT (Right) as a surgical intervention .  The patient's history has been reviewed, patient examined, no change in status, stable for surgery.  I have reviewed the patient's chart and labs.  Questions were answered to the patient's satisfaction.     Franky Macho A

## 2013-05-09 NOTE — Op Note (Signed)
Patient:  Charles [name]  DOB:  05-04-1966  MRN:  [id number]   Preop Diagnosis:  Right inguinal hernia  Postop Diagnosis:  Same, indirect  Procedure:  Right anal herniorrhaphy with mesh  Surgeon:  Franky Macho, M.D.  Anes:  General endotracheal  Indications:  Patient is a 47 year old black male who presents with a symptomatic right inguinal hernia. The risks and benefits of the procedure including bleeding, infection, and recurrence of the hernia were fully explained to the patient, who gave informed consent.  Procedure note:  The patient is placed the supine position. After induction of general endotracheal anesthesia, the right groin region was prepped and draped using usual sterile technique with Betadine. Surgical site confirmation was performed.  A transverse incision was made in the right groin region down to the external oblique aponeuroses. The aponeuroses was incised to the external ring. The ilioinguinal nerve was identified retracted superiorly from the operative field. A Penrose drain was placed around the spermatic cord. A penrose drain was placed around the spermatic cord. An indirect hernia sac was found. This was freed away from the spermatic cord up to the peritoneal reflection and inverted. A medium-sized Bard PerFix plug was then inserted into this region. An onlay patch was then placed along the floor of inguinal canal and secured superiorly to the conjoined tendon and inferiorly to the shelving edge of Poupart's ligament using 2-0 Novafil interrupted sutures. The internal ring was recreated using a 2-0 Novafil interrupted sutures. The external oblique aponeuroses was reapproximated using a 2-0 Vicryl running suture. Subcutaneous layer was reapproximated using 3-0 Vicryl interrupted suture. The skin was closed using a 4-0 Vicryl subcuticular suture. Exparel was instilled into the surrounding wound. Dermabond was then applied.  All tape and needle counts were correct at the  end of the procedure. Patient was extubated in the operating room and transferred to PACU in stable condition.  Complications:  None  EBL:  Minimal  Specimen:  None

## 2013-05-09 NOTE — Anesthesia Preprocedure Evaluation (Signed)
Anesthesia Evaluation  Patient identified by MRN, date of birth, ID band Patient awake    Reviewed: Allergy & Precautions, H&P , NPO status , Patient's Chart, lab work & pertinent test results  Airway Mallampati: II TM Distance: >3 FB   Mouth opening: Limited Mouth Opening  Dental  (+) Poor Dentition, Missing, Chipped and Dental Advisory Given   Pulmonary neg pulmonary ROS,  breath sounds clear to auscultation        Cardiovascular negative cardio ROS  Rhythm:Regular Rate:Normal     Neuro/Psych  Headaches,    GI/Hepatic GERD-  Medicated,  Endo/Other    Renal/GU      Musculoskeletal   Abdominal   Peds  Hematology   Anesthesia Other Findings   Reproductive/Obstetrics                           Anesthesia Physical Anesthesia Plan  ASA: II  Anesthesia Plan: General   Post-op Pain Management:    Induction: Intravenous, Rapid sequence and Cricoid pressure planned  Airway Management Planned: Oral ETT  Additional Equipment:   Intra-op Plan:   Post-operative Plan: Extubation in OR  Informed Consent: I have reviewed the patients History and Physical, chart, labs and discussed the procedure including the risks, benefits and alternatives for the proposed anesthesia with the patient or authorized representative who has indicated his/her understanding and acceptance.   Dental advisory given  Plan Discussed with:   Anesthesia Plan Comments:         Anesthesia Quick Evaluation

## 2013-05-09 NOTE — Addendum Note (Signed)
Addendum created 05/09/13 1138 by Moshe Salisbury, CRNA   Modules edited: Anesthesia Blocks and Procedures

## 2013-05-12 ENCOUNTER — Encounter (HOSPITAL_COMMUNITY): Payer: Self-pay | Admitting: General Surgery

## 2013-09-10 ENCOUNTER — Telehealth: Payer: Self-pay | Admitting: Gastroenterology

## 2013-09-10 MED ORDER — DEXLANSOPRAZOLE 60 MG PO CPDR
60.0000 mg | DELAYED_RELEASE_CAPSULE | Freq: Every day | ORAL | Status: DC
Start: 2013-09-10 — End: 2014-02-21

## 2013-09-10 NOTE — Telephone Encounter (Signed)
Pt's wife called to see if we would call in a Dexilant RX to the pharmacy at [facility] in [place] on [address]. She would like a return call to let her know so she can pick it up. [phone]

## 2013-09-10 NOTE — Telephone Encounter (Signed)
Routing to the refill box. 

## 2013-09-10 NOTE — Telephone Encounter (Signed)
Done

## 2014-02-21 ENCOUNTER — Emergency Department (HOSPITAL_COMMUNITY)
Admission: EM | Admit: 2014-02-21 | Discharge: 2014-02-21 | Disposition: A | Discharge status: Home / Self Care | Payer: Managed Care, Other (non HMO) | Attending: Emergency Medicine | Admitting: Emergency Medicine

## 2014-02-21 ENCOUNTER — Encounter (HOSPITAL_COMMUNITY): Payer: Self-pay | Admitting: Emergency Medicine

## 2014-02-21 DIAGNOSIS — M5440 Lumbago with sciatica, unspecified side: Secondary | ICD-10-CM

## 2014-02-21 DIAGNOSIS — M545 Low back pain, unspecified: Secondary | ICD-10-CM | POA: Insufficient documentation

## 2014-02-21 DIAGNOSIS — M5430 Sciatica, unspecified side: Secondary | ICD-10-CM | POA: Diagnosis not present

## 2014-02-21 DIAGNOSIS — Z8719 Personal history of other diseases of the digestive system: Secondary | ICD-10-CM | POA: Insufficient documentation

## 2014-02-21 DIAGNOSIS — Z862 Personal history of diseases of the blood and blood-forming organs and certain disorders involving the immune mechanism: Secondary | ICD-10-CM | POA: Diagnosis not present

## 2014-02-21 DIAGNOSIS — Z8639 Personal history of other endocrine, nutritional and metabolic disease: Secondary | ICD-10-CM | POA: Insufficient documentation

## 2014-02-21 MED ORDER — CYCLOBENZAPRINE HCL 10 MG PO TABS
10.0000 mg | ORAL_TABLET | Freq: Once | ORAL | Status: AC
  Administered 2014-02-21: 10 mg via ORAL
  Filled 2014-02-21: qty 1

## 2014-02-21 MED ORDER — CYCLOBENZAPRINE HCL 10 MG PO TABS: 10.0000 mg | ORAL_TABLET | Freq: Three times a day (TID) | ORAL | Status: DC | PRN

## 2014-02-21 MED ORDER — OXYCODONE-ACETAMINOPHEN 5-325 MG PO TABS
1.0000 | ORAL_TABLET | Freq: Once | ORAL | Status: AC
  Administered 2014-02-21: 1 via ORAL
  Filled 2014-02-21: qty 1

## 2014-02-21 MED ORDER — OXYCODONE-ACETAMINOPHEN 5-325 MG PO TABS
1.0000 | ORAL_TABLET | ORAL | Status: DC | PRN
Start: 2014-02-21 — End: 2014-04-08

## 2014-02-21 MED ORDER — PREDNISONE 10 MG PO TABS: ORAL_TABLET | ORAL | Status: DC

## 2014-02-21 NOTE — ED Notes (Signed)
Intermittent lumbosacral back pain over last year.  Today pain was so bad he couldn't get out of bed this morning.  States he was unable to go to work b/c he could barely walk.  Pain is radiating down back of both legs to mid thigh.  He has taken Advil w/out relief.  States he has had radiographs done of back in past.

## 2014-02-21 NOTE — ED Notes (Signed)
Patient with no complaints at this time. Respirations even and unlabored. Skin warm/dry. Discharge instructions reviewed with patient at this time. Patient given opportunity to voice concerns/ask questions. Patient discharged at this time and left Emergency Department with steady gait.   

## 2014-02-21 NOTE — Discharge Instructions (Signed)
Back Pain, Adult Back pain is very common. The pain often gets better over time. The cause of back pain is usually not dangerous. Most people can learn to manage their back pain on their own.  HOME CARE   Stay active. Start with short walks on flat ground if you can. Try to walk farther each day.  Do not sit, drive, or stand in one place for more than 30 minutes. Do not stay in bed.  Do not avoid exercise or work. Activity can help your back heal faster.  Be careful when you bend or lift an object. Bend at your knees, keep the object close to you, and do not twist.  Sleep on a firm mattress. Lie on your side, and bend your knees. If you lie on your back, put a pillow under your knees.  Only take medicines as told by your doctor.  Put ice on the injured area.  Put ice in a plastic bag.  Place a towel between your skin and the bag.  Leave the ice on for 15-20 minutes, 03-04 times a day for the first 2 to 3 days. After that, you can switch between ice and heat packs.  Ask your doctor about back exercises or massage.  Avoid feeling anxious or stressed. Find good ways to deal with stress, such as exercise. GET HELP RIGHT AWAY IF:   Your pain does not go away with rest or medicine.  Your pain does not go away in 1 week.  You have new problems.  You do not feel well.  The pain spreads into your legs.  You cannot control when you poop (bowel movement) or pee (urinate).  Your arms or legs feel weak or lose feeling (numbness).  You feel sick to your stomach (nauseous) or throw up (vomit).  You have belly (abdominal) pain.  You feel like you may pass out (faint). MAKE SURE YOU:   Understand these instructions.  Will watch your condition.  Will get help right away if you are not doing well or get worse. Document Released: 11/01/2007 Document Revised: 08/07/2011 Document Reviewed: 09/16/2013 [facility] Patient Information 2015 [place], [place]. This information is not intended  to replace advice given to you by your health care provider. Make sure you discuss any questions you have with your health care provider.  Sciatica Sciatica is pain, weakness, numbness, or tingling along your sciatic nerve. The nerve starts in the lower back and runs down the back of each leg. Nerve damage or certain conditions pinch or put pressure on the sciatic nerve. This causes the pain, weakness, and other discomforts of sciatica. HOME CARE   Only take medicine as told by your doctor.  Apply ice to the affected area for 20 minutes. Do this 3-4 times a day for the first 48-72 hours. Then try heat in the same way.  Exercise, stretch, or do your usual activities if these do not make your pain worse.  Go to physical therapy as told by your doctor.  Keep all doctor visits as told.  Do not wear high heels or shoes that are not supportive.  Get a firm mattress if your mattress is too soft to lessen pain and discomfort. GET HELP RIGHT AWAY IF:   You cannot control when you poop (bowel movement) or pee (urinate).  You have more weakness in your lower back, lower belly (pelvis), butt (buttocks), or legs.  You have redness or puffiness (swelling) of your back.  You have a burning feeling  when you pee.  You have pain that gets worse when you lie down.  You have pain that wakes you from your sleep.  Your pain is worse than past pain.  Your pain lasts longer than 4 weeks.  You are suddenly losing weight without reason. MAKE SURE YOU:   Understand these instructions.  Will watch this condition.  Will get help right away if you are not doing well or get worse. Document Released: 02/22/2008 Document Revised: 11/14/2011 Document Reviewed: 09/24/2011 [facility] Patient Information 2015 [place], [place]. This information is not intended to replace advice given to you by your health care provider. Make sure you discuss any questions you have with your health care provider.

## 2014-02-21 NOTE — ED Notes (Signed)
Patient states his back pain has been off and on but got worse today. 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

## 2014-02-23 NOTE — ED Provider Notes (Signed)
CSN: [id number]     Arrival date & time 02/21/14  1736 History   First MD Initiated Contact with Patient 02/21/14 1757     Chief Complaint  Patient presents with  . Back Pain     (Consider location/radiation/quality/duration/timing/severity/associated sxs/prior Treatment) HPI  Charles [name] is a 48 y.o. male who presents to the Emergency Department complaining of low back pain that has been worsening for one week.  He states that he does a lot of heavy lifting and bending at his job and believes this has exacerbated his pain.  He also states the pain radiates into his buttocks and upper legs at times , but is not constant.  He states the pain is constant , but increases with bending and certain movements.  He has tried over the counter medications and muscle rubs w/o relief.  He denies incontinence of urine or bowel, dysuria, abdominal pain, numbness or weakness to his lower extremities.     Past Medical History  Diagnosis Date  . Abdominal pain   . Headache(784.0)   . Hyperlipidemia   . Constipation   . GERD (gastroesophageal reflux disease)    Past Surgical History  Procedure Laterality Date  . Colonoscopy  03/05/2012    XHB:ZJIRC internal hemorrhoids/Two sessile polyps HYPERPLASTIC  . Esophagogastroduodenoscopy  03/05/2012    SLF:Non-erosive gastritis (inflammation), negative H.pylori, s/p empiric Savary dilation  . Inguinal hernia repair Right 05/09/2013    Procedure: HERNIA REPAIR INGUINAL ADULT;  Surgeon: Jamesetta So, MD;  Location: [facility];  Service: General;  Laterality: Right;   Family History  Problem Relation Age of Onset  . Colon cancer Neg Hx   . Inflammatory bowel disease Neg Hx   . Liver disease Neg Hx    History  Substance Use Topics  . Smoking status: Never Smoker   . Smokeless tobacco: Not on file  . Alcohol Use: 2.5 oz/week    5 drink(s) per week     Comment: vodka, twice per week, 1 cup in two mixed drinks.     Review of Systems  Constitutional:  Negative for fever.  Respiratory: Negative for shortness of breath.   Gastrointestinal: Negative for vomiting, abdominal pain and constipation.  Genitourinary: Negative for dysuria, hematuria, flank pain, decreased urine volume and difficulty urinating.  Musculoskeletal: Positive for back pain. Negative for joint swelling.  Skin: Negative for rash.  Neurological: Negative for weakness and numbness.  All other systems reviewed and are negative.     Allergies  Review of patient's allergies indicates no known allergies.  Home Medications   Prior to Admission medications   Medication Sig Start Date End Date Taking? Authorizing Provider  Multiple Vitamin (MULTIVITAMIN WITH MINERALS) TABS tablet Take 1 tablet by mouth daily.   Yes Historical Provider, MD  cyclobenzaprine (FLEXERIL) 10 MG tablet Take 1 tablet (10 mg total) by mouth 3 (three) times daily as needed. 02/21/14   Snow Peoples L. Gabbi Whetstone, PA-C  oxyCODONE-acetaminophen (PERCOCET/ROXICET) 5-325 MG per tablet Take 1 tablet by mouth every 4 (four) hours as needed. 02/21/14   Britteney Ayotte L. Elester Apodaca, PA-C  predniSONE (DELTASONE) 10 MG tablet Take 6 tablets day one, 5 tablets day two, 4 tablets day three, 3 tablets day four, 2 tablets day five, then 1 tablet day six 02/21/14   Atianna Haidar L. Salina Stanfield, PA-C   BP 149/94  Pulse 72  Temp(Src) 99.1 F (37.3 C) (Oral)  Resp 16  Ht 6\' 3"  (1.905 m)  Wt 175 lb (79.379 kg)  BMI 21.87  kg/m2  SpO2 100% Physical Exam  Nursing note and vitals reviewed. Constitutional: He is oriented to person, place, and time. He appears well-developed and well-nourished. No distress.  HENT:  Head: Normocephalic and atraumatic.  Neck: Normal range of motion. Neck supple.  Cardiovascular: Normal rate, regular rhythm, normal heart sounds and intact distal pulses.   No murmur heard. Pulmonary/Chest: Effort normal and breath sounds normal. No respiratory distress.  Abdominal: Soft. He exhibits no distension. There is no tenderness.   Musculoskeletal: He exhibits tenderness. He exhibits no edema.       Lumbar back: He exhibits tenderness and pain. He exhibits normal range of motion, no swelling, no deformity, no laceration and normal pulse.  ttp of the lower lumbar spine and  paraspinal muscles.  .  DP pulses are brisk and symmetrical.  Distal sensation intact.  Hip Flexors/Extensors are intact.  Pt has 5/5 strength against resistance of bilateral lower extremities.     Neurological: He is alert and oriented to person, place, and time. He has normal strength. No sensory deficit. He exhibits normal muscle tone. Coordination and gait normal.  Reflex Scores:      Patellar reflexes are 2+ on the right side and 2+ on the left side.      Achilles reflexes are 2+ on the right side and 2+ on the left side. Skin: Skin is warm and dry. No rash noted.    ED Course  Procedures (including critical care time) Labs Review Labs Reviewed - No data to display  Imaging Review No results found.   EKG Interpretation None      MDM   Final diagnoses:  Midline low back pain with sciatica, sciatica laterality unspecified    Pt is non-toxic appearing.  No concerning sx's for emergent neurological or infectious process.  No focal deficits, ambulates with steady gait.  He agrees to symptomatic treatment with flexeril, percocet and prednisone taper.  He appears stable for d/c and agrees to f/u with PCP or return here if sx's worsen    Massa Pe L. Vanessa Winchester, PA-C 02/23/14 1158

## 2014-02-24 NOTE — ED Provider Notes (Signed)
Medical screening examination/treatment/procedure(s) were performed by non-physician practitioner and as supervising physician I was immediately available for consultation/collaboration.   EKG Interpretation None       Nat Christen, MD 02/24/14 2300

## 2014-04-08 ENCOUNTER — Ambulatory Visit (INDEPENDENT_AMBULATORY_CARE_PROVIDER_SITE_OTHER): Payer: Managed Care, Other (non HMO) | Admitting: Gastroenterology

## 2014-04-08 ENCOUNTER — Encounter: Payer: Self-pay | Admitting: Gastroenterology

## 2014-04-08 VITALS — BP 134/80 | HR 72 | Temp 97.8°F | Ht 75.0 in | Wt 175.0 lb

## 2014-04-08 DIAGNOSIS — R1013 Epigastric pain: Secondary | ICD-10-CM

## 2014-04-08 MED ORDER — DEXLANSOPRAZOLE 60 MG PO CPDR: 60.0000 mg | DELAYED_RELEASE_CAPSULE | Freq: Every day | ORAL | Status: DC

## 2014-04-08 NOTE — Patient Instructions (Signed)
I have given you samples of Dexilant to start taking. Take this once each morning. The prescription was sent to the pharmacy; we may need to get this approved through your insurance. We will work on it from our end. I have also given you a copay card to help with cost.   I would like to see you back in 3 months.  Please call with an update in about 10 days. We may need to do further testing. Continue to avoid alcohol.

## 2014-04-08 NOTE — Progress Notes (Signed)
Referring Provider: [name], MD Primary Care Physician:  [name], MD  Primary GI: Dr. Oneida Alar   Chief Complaint  Patient presents with  . Abdominal Pain    Everyday  . Nausea    Everyday    HPI:   Charles [name] presents today in follow-up with history of GERD. TCS/EGD on file from 2013. Feels like abdominal pain and nausea has worsened. Notes epigastric pain, daily. Pain starts 15-20 minutes after waking. Won't eat till around noon. Chronic (2 years). Nausea main concern. Sometimes no appetite at all. Sometimes feels like it is going to come right back up. No abdominal pain with eating, actually pain eased a bit but nausea worsened with eating. Was taking Dexilant in the past, none in a year. Insurance stopped paying. Seemed to help symptoms. Advil for migraines but not regularly.   Tried/failed Prilosec, Protonix. No lower GI symptoms.   Past Medical History  Diagnosis Date  . Abdominal pain   . Headache(784.0)   . Hyperlipidemia   . Constipation   . GERD (gastroesophageal reflux disease)     Past Surgical History  Procedure Laterality Date  . Colonoscopy  03/05/2012    [id number] internal hemorrhoids/Two sessile polyps HYPERPLASTIC  . Esophagogastroduodenoscopy  03/05/2012    SLF:Non-erosive gastritis (inflammation), negative H.pylori, s/p empiric Savary dilation  . Inguinal hernia repair Right 05/09/2013    Procedure: HERNIA REPAIR INGUINAL ADULT;  Surgeon: Jamesetta So, MD;  Location: [facility];  Service: General;  Laterality: Right;    Current Outpatient Prescriptions  Medication Sig Dispense Refill  . Multiple Vitamin (MULTIVITAMIN WITH MINERALS) TABS tablet Take 1 tablet by mouth daily.    Marland Kitchen dexlansoprazole (DEXILANT) 60 MG capsule Take 1 capsule (60 mg total) by mouth daily. 90 capsule 3   No current facility-administered medications for this visit.    Allergies as of 04/08/2014  . (No Known Allergies)    Family History  Problem Relation  Age of Onset  . Colon cancer Neg Hx   . Inflammatory bowel disease Neg Hx   . Liver disease Neg Hx     History   Social History  . Marital Status: Single    Spouse Name: N/A    Number of Children: 4  . Years of Education: N/A   Occupational History  . [employer]     Social History Main Topics  . Smoking status: Never Smoker   . Smokeless tobacco: None  . Alcohol Use: 3.0 oz/week    5 Not specified per week     Comment: vodka, twice per week, 1 cup in two mixed drinks. (as of 11/11 no etoh in about a month)  . Drug Use: No  . Sexual Activity: Yes    Birth Control/ Protection: None   Other Topics Concern  . None   Social History Narrative    Review of Systems: As mentioned in HPI  Physical Exam: BP 134/80 mmHg  Pulse 72  Temp(Src) 97.8 F (36.6 C) (Oral)  Ht 6\' 3"  (1.905 m)  Wt 175 lb (79.379 kg)  BMI 21.87 kg/m2 General:   Alert and oriented. No distress noted. Pleasant and cooperative.  Head:  Normocephalic and atraumatic. Eyes:  Conjuctiva clear without scleral icterus. Heart:  S1, S2 present without murmurs, rubs, or gallops. Regular rate and rhythm. Abdomen:  +BS, soft, non-tender and non-distended. No rebound or guarding. No HSM or masses noted. Msk:  Symmetrical without gross deformities. Normal posture. Extremities:  Without edema. Neurologic:  Alert and  oriented x4;  grossly normal neurologically. Skin:  Intact without significant lesions or rashes. Psych:  Alert and cooperative. Normal mood and affect.

## 2014-04-13 NOTE — Assessment & Plan Note (Signed)
In the setting of known gastritis, hx of ETOH use, no PPI. Needs to restart Dexilant. May need to perform PA for coverage. Co-pay cards provided. Start Dexilant, progress report in 10 days. Consider US abdomen or possible updated EGD depending on symptomatology. No red flags. Return in 3 months.

## 2014-04-22 NOTE — Progress Notes (Signed)
cc'ed to pcp °

## 2014-05-12 ENCOUNTER — Telehealth: Payer: Self-pay | Admitting: Gastroenterology

## 2014-05-12 NOTE — Telephone Encounter (Signed)
Pt's girlfriend called and wants patient to see SF ONLY. He has recently seen AS on Nov 11 and she is saying that this has been an on going problem for years and he has never seen SF during all of it and wants OV made with her. She is aware of OV on 1/21 at 9. She also said that the dexilant is not helping him any and needs something else called into his pharmacy. Please advise and call [phone]

## 2014-05-13 NOTE — Telephone Encounter (Signed)
If he is still not improving with Dexilant, he needs to have an EGD with Dr. Oneida Alar. I had requested him to call in 10 day with a progress report. Since he is not improving, this would be the next step.

## 2014-05-14 NOTE — Telephone Encounter (Signed)
Called pt this morning and LMOM to call office back to schedule EGD.

## 2014-05-18 NOTE — Telephone Encounter (Signed)
Tried to call with no answer  

## 2014-05-18 NOTE — Telephone Encounter (Signed)
Pt's girlfriend(Peggy) is wanting to see if her can have the xray with the dye done since it is less in vases. He has already had 2 EGD. Please advise. Peggy's call back number is [phone]

## 2014-05-18 NOTE — Telephone Encounter (Signed)
Called pt again. LMOM about setting up EGD.

## 2014-05-18 NOTE — Telephone Encounter (Signed)
Looks like last US abdomen was in May 2013 without gallstones. HIDA in June 2014 normal with EF of 88%. HOWEVER, patient did experience nausea with the CCK infusion.   A CT will not be as sensitive for gallstones. Stones could be potentially missed. Nausea worsened with eating, pain seems to ease a bit. He had been off a PPI when I saw him in Nov 2015.   US abdomen could be ordered. Is it possible to add +/- HIDA IN CASE ultrasound is normal? This way, he has only one trip.

## 2014-05-19 NOTE — Telephone Encounter (Signed)
Pt would like to have the [place] and HIDA scan done.

## 2014-05-20 ENCOUNTER — Other Ambulatory Visit: Payer: Self-pay

## 2014-05-20 DIAGNOSIS — R1013 Epigastric pain: Secondary | ICD-10-CM

## 2014-05-20 DIAGNOSIS — R112 Nausea with vomiting, unspecified: Secondary | ICD-10-CM

## 2014-05-20 DIAGNOSIS — R109 Unspecified abdominal pain: Secondary | ICD-10-CM

## 2014-05-25 NOTE — Telephone Encounter (Signed)
Pre the machine at [place]. He does not need a PA for the [place] and the HIDA scan. The reference numbers are [phone] and [phone].

## 2014-05-25 NOTE — Telephone Encounter (Signed)
Pt is set up for [place] on 05/27/14 @ 7:45. I left message on his machine. He must be NPO after midnight

## 2014-05-27 ENCOUNTER — Ambulatory Visit (HOSPITAL_COMMUNITY): Payer: Managed Care, Other (non HMO)

## 2014-06-02 ENCOUNTER — Telehealth: Payer: Self-pay

## 2014-06-02 ENCOUNTER — Ambulatory Visit (HOSPITAL_COMMUNITY)
Admission: RE | Admit: 2014-06-02 | Discharge: 2014-06-02 | Disposition: A | Discharge status: Home / Self Care | Payer: Managed Care, Other (non HMO) | Source: Ambulatory Visit | Attending: Gastroenterology | Admitting: Gastroenterology

## 2014-06-02 DIAGNOSIS — R1013 Epigastric pain: Secondary | ICD-10-CM | POA: Insufficient documentation

## 2014-06-02 DIAGNOSIS — R112 Nausea with vomiting, unspecified: Secondary | ICD-10-CM | POA: Diagnosis present

## 2014-06-02 DIAGNOSIS — R109 Unspecified abdominal pain: Secondary | ICD-10-CM

## 2014-06-02 NOTE — Telephone Encounter (Signed)
Peggy(girlfriend) called this morning because patient when Monday to have his [place] and it was normal but they did not do his HIDA scan. So now he is rescheduled for the HIDA on 06-10-14 @ 8:00 am. Vickii Chafe is aware.

## 2014-06-03 ENCOUNTER — Other Ambulatory Visit (HOSPITAL_COMMUNITY): Payer: Managed Care, Other (non HMO)

## 2014-06-03 NOTE — Telephone Encounter (Signed)
Noted  

## 2014-06-09 ENCOUNTER — Telehealth: Payer: Self-pay | Admitting: Gastroenterology

## 2014-06-09 NOTE — Telephone Encounter (Signed)
PATIENT CALLED INQUIRING ABOUT ULTRASOUND RESULTS FROM TWO WEEKS AGO.  PLEASE ADVISE

## 2014-06-10 ENCOUNTER — Encounter (HOSPITAL_COMMUNITY): Payer: Self-pay

## 2014-06-10 ENCOUNTER — Encounter (HOSPITAL_COMMUNITY)
Admission: RE | Admit: 2014-06-10 | Discharge: 2014-06-10 | Disposition: A | Discharge status: Home / Self Care | Payer: Managed Care, Other (non HMO) | Source: Ambulatory Visit | Attending: Gastroenterology | Admitting: Gastroenterology

## 2014-06-10 DIAGNOSIS — R112 Nausea with vomiting, unspecified: Secondary | ICD-10-CM

## 2014-06-10 DIAGNOSIS — R109 Unspecified abdominal pain: Secondary | ICD-10-CM | POA: Diagnosis not present

## 2014-06-10 DIAGNOSIS — R110 Nausea: Secondary | ICD-10-CM | POA: Diagnosis not present

## 2014-06-10 DIAGNOSIS — R1013 Epigastric pain: Secondary | ICD-10-CM

## 2014-06-10 MED ORDER — STERILE WATER FOR INJECTION IJ SOLN
INTRAMUSCULAR | Status: AC
Start: 2014-06-10 — End: 2014-06-10
  Administered 2014-06-10: 5 mL via INTRAVENOUS
  Filled 2014-06-10: qty 10

## 2014-06-10 MED ORDER — TECHNETIUM TC 99M MEBROFENIN IV KIT
5.0000 | PACK | Freq: Once | INTRAVENOUS | Status: AC | PRN
  Administered 2014-06-10: 5 via INTRAVENOUS

## 2014-06-10 MED ORDER — SINCALIDE 5 MCG IJ SOLR
INTRAMUSCULAR | Status: AC
  Administered 2014-06-10: 1.59 ug
  Filled 2014-06-10: qty 5

## 2014-06-10 MED ORDER — SODIUM CHLORIDE 0.9 % IJ SOLN
INTRAMUSCULAR | Status: AC
  Filled 2014-06-10: qty 36

## 2014-06-10 NOTE — Telephone Encounter (Signed)
Pt was informed this morning. See result note.

## 2014-06-10 NOTE — Progress Notes (Signed)
Quick Note:  Pt is aware and he said he just completed the HIDA scan. ______

## 2014-06-18 ENCOUNTER — Ambulatory Visit (INDEPENDENT_AMBULATORY_CARE_PROVIDER_SITE_OTHER): Payer: Managed Care, Other (non HMO) | Admitting: Gastroenterology

## 2014-06-18 ENCOUNTER — Encounter: Payer: Self-pay | Admitting: Gastroenterology

## 2014-06-18 ENCOUNTER — Other Ambulatory Visit: Payer: Self-pay

## 2014-06-18 VITALS — BP 136/79 | HR 72 | Temp 97.1°F | Ht 75.0 in | Wt 178.8 lb

## 2014-06-18 DIAGNOSIS — R112 Nausea with vomiting, unspecified: Secondary | ICD-10-CM

## 2014-06-18 DIAGNOSIS — R110 Nausea: Secondary | ICD-10-CM

## 2014-06-18 DIAGNOSIS — R109 Unspecified abdominal pain: Secondary | ICD-10-CM

## 2014-06-18 DIAGNOSIS — R1013 Epigastric pain: Secondary | ICD-10-CM

## 2014-06-18 MED ORDER — ONDANSETRON 4 MG PO TBDP: ORAL_TABLET | ORAL | Status: DC

## 2014-06-18 NOTE — Assessment & Plan Note (Signed)
ASSOCIATED WITH EPIGASTRIC PAIN, AND CHANGE IN BOWEL HABITS & ETIOLOGY UNCLEAR-MAY BE DUE TO GASTROPARESIS, LESS LIKELY PUD, CELIAC SPRUE, CHRONIC PANCREATITIS, OR EOSINOPHILIC GASTRITIS.  GES NEXT TUES EGD TO BIOPSY GASTRIC AN DUODENAL MUCOSA. DISCUSSED PROCEDURE, BENEFITS, & RISKS: < 1% chance of medication reaction, OR bleeding. IF NO H PYLORI, PT WILL NEED AN EUS. DISCUSSED PLAN WITH PT WHO IS FRUSTRATED THAT WE CANNOT FIND A REASON FOR HIS PAIN AND KEEP ORDERING TESTS. EXPLAINED WHY WE NEED TO CONTINUE TO PURSUE A DIAGNOSIS. CONTINUE DEXILANT. ZOFRAN PRN FOR NAUSEA FOLLOW UP IN 3 MOS.

## 2014-06-18 NOTE — Progress Notes (Signed)
Per machine does not need PA for GES

## 2014-06-18 NOTE — Patient Instructions (Signed)
COMPLETE GASTRIC EMPTYING STUDY NEXT TUES. IT WILL TAKE 2 TO 3 HRS TO COMPLETE.  UPPER ENDOSCOPY NEXT FRI JAN 29.  CONTINUE DEXILANT  USE ZOFRAN AS NEEDED FOR NAUSEA.  FOLLOW UP IN 3 MOS.

## 2014-06-18 NOTE — Progress Notes (Signed)
   Subjective:    Patient ID: Charles [name], male    DOB: 1965-09-28, 49 y.o.   MRN: [id number]  [name], MD   HPI NAUSEA AND ABDOMINAL PAIN. [place] DIDN'T HELP. SX WERE A LITTLE BETTER AFTER EGD/TCS BUT THEN THEY GOT WORSE. NO CHANGE IN DIET. NO ETOH: 2 MOS. NO VOMITING. 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 NO WORSE WITH LIFTING. HERNIA OPERATION FOR GROIN PAIN. MAY BE ASSOCIATED WITH CONSTIPATION OR DIARRHEA. EATS FAIR AMOUNT OF SPICY OR FRIED FOOD. RARE ADVIL. NO ASPIRIN, BC/GOODY POWDERS, OR NAPROXEN/ALEVE. TRIGGERS: WAKES UP AND 5-10 MIN SLATER PAIN/NAUSEA STARTS. CAN WAKE HIM UP IN THE MIDDLE OF THE NIGHT. USED TO BE EATING HELPED BUT NOT NOW. PAIN SI DULL AND DURING MAY BECOME SHARP INTENSE(3-4X/DAY). NAUSEA AND ABD PAIN MAY OCCUR ALONE OR TOGETHER. ALWAYS STARTS IN EPIGASTRIUM AND MOVES TO LUQ. BMs: 3X/DAY(#3-4). PAIN WAS OFF AND AND NOW IT'S ALL DAY EVERY DAY. NAUSEA IS THE WORSE PART. MIGRAINES BAD. VISION DECREASED. NO PAIN IN FOREHEAD OR CHEEKS, AND/PND, RINGING IN EARS,  EAR PAIN, SORE THROAT, OR PROBLEMS WITH BALANCE OR WALKING. GAINED 3 LBS SINCE NOV 2015.   PT DENIES FEVER, CHILLS, HEMATOCHEZIA, melena, CHEST PAIN, SHORTNESS OF BREATH,  constipation, problems swallowing, problems with sedation, OR heartburn or indigestion.  Past Medical History  Diagnosis Date  . Abdominal pain   . Headache(784.0)   . Hyperlipidemia   . Constipation   . GERD (gastroesophageal reflux disease)    Past Surgical History  Procedure Laterality Date  . Colonoscopy  03/05/2012    [id number] internal hemorrhoids/Two sessile polyps HYPERPLASTIC  . Esophagogastroduodenoscopy  03/05/2012    SLF:Non-erosive gastritis (inflammation), negative H.pylori, s/p empiric Savary dilation  . Inguinal hernia repair Right 05/09/2013    Procedure: HERNIA REPAIR INGUINAL ADULT;  Surgeon: Jamesetta So, MD;  Location: [facility];  Service: General;  Laterality: Right;   No  Known Allergies  Current Outpatient Prescriptions  Medication Sig Dispense Refill  . dexlansoprazole (DEXILANT) 60 MG capsule Take 1 capsule (60 mg total) by mouth daily. FOR 1-2 MOS   . Multiple Vitamin (MULTIVITAMIN WITH MINERALS) TABS tablet Take 1 tablet by mouth daily.      Review of Systems FAINTED AFTER HIDA SCAN    Objective:   Physical Exam  Constitutional: He is oriented to person, place, and time. He appears well-developed and well-nourished. No distress.  HENT:  Head: Normocephalic and atraumatic.  Mouth/Throat: Oropharynx is clear and moist. No oropharyngeal exudate.  Eyes: Pupils are equal, round, and reactive to light. No scleral icterus.  Neck: Normal range of motion. Neck supple.  Cardiovascular: Normal rate, regular rhythm and normal heart sounds.   Pulmonary/Chest: Effort normal and breath sounds normal. No respiratory distress.  Abdominal: Soft. Bowel sounds are normal. He exhibits no distension. There is tenderness. There is no rebound and no guarding.  MILD TTP IN THE EPIGASTRIUM   Musculoskeletal: He exhibits no edema.  Lymphadenopathy:    He has no cervical adenopathy.  Neurological: He is alert and oriented to person, place, and time.  NO FOCAL DEFICITS   Psychiatric:  FLAT AFFECT, SLIGHTLY ANXIOUS MOOD   Vitals reviewed.         Assessment & Plan:

## 2014-06-18 NOTE — Progress Notes (Signed)
ON RECALL LIST  °

## 2014-06-22 NOTE — Progress Notes (Signed)
cc'ed to pcp °

## 2014-06-23 ENCOUNTER — Encounter (HOSPITAL_COMMUNITY)
Admission: RE | Admit: 2014-06-23 | Discharge: 2014-06-23 | Disposition: A | Discharge status: Home / Self Care | Payer: Managed Care, Other (non HMO) | Source: Ambulatory Visit | Attending: Gastroenterology | Admitting: Gastroenterology

## 2014-06-23 ENCOUNTER — Encounter (HOSPITAL_COMMUNITY): Payer: Self-pay

## 2014-06-23 DIAGNOSIS — R112 Nausea with vomiting, unspecified: Secondary | ICD-10-CM | POA: Diagnosis not present

## 2014-06-23 MED ORDER — TECHNETIUM TC 99M SULFUR COLLOID
2.0000 | Freq: Once | INTRAVENOUS | Status: AC | PRN
  Administered 2014-06-23: 2 via ORAL

## 2014-06-25 ENCOUNTER — Telehealth: Payer: Self-pay

## 2014-06-25 NOTE — Telephone Encounter (Signed)
Called pt to see if he could be at [facility] @ 1100 on 06/26/2014 for his procedure. Hospital is trying to fill some gaps. LMOM for pt to call back

## 2014-06-25 NOTE — Telephone Encounter (Signed)
Pt called and said he can be at the [facility] on 06/26/2014.  He will be there at 1030 for an 1130

## 2014-06-26 ENCOUNTER — Encounter (HOSPITAL_COMMUNITY): Payer: Self-pay | Admitting: *Deleted

## 2014-06-26 ENCOUNTER — Ambulatory Visit (HOSPITAL_COMMUNITY)
Admission: RE | Admit: 2014-06-26 | Discharge: 2014-06-26 | Disposition: A | Discharge status: Home / Self Care | Payer: Managed Care, Other (non HMO) | Source: Ambulatory Visit | Attending: Gastroenterology | Admitting: Gastroenterology

## 2014-06-26 ENCOUNTER — Encounter (HOSPITAL_COMMUNITY)
Admission: RE | Disposition: A | Discharge status: Home / Self Care | Payer: Self-pay | Source: Ambulatory Visit | Attending: Gastroenterology

## 2014-06-26 DIAGNOSIS — R1013 Epigastric pain: Secondary | ICD-10-CM | POA: Diagnosis present

## 2014-06-26 DIAGNOSIS — K2970 Gastritis, unspecified, without bleeding: Secondary | ICD-10-CM | POA: Insufficient documentation

## 2014-06-26 DIAGNOSIS — K219 Gastro-esophageal reflux disease without esophagitis: Secondary | ICD-10-CM | POA: Insufficient documentation

## 2014-06-26 DIAGNOSIS — K319 Disease of stomach and duodenum, unspecified: Secondary | ICD-10-CM | POA: Diagnosis not present

## 2014-06-26 DIAGNOSIS — R1010 Upper abdominal pain, unspecified: Secondary | ICD-10-CM | POA: Diagnosis not present

## 2014-06-26 DIAGNOSIS — R109 Unspecified abdominal pain: Secondary | ICD-10-CM

## 2014-06-26 DIAGNOSIS — R112 Nausea with vomiting, unspecified: Secondary | ICD-10-CM

## 2014-06-26 DIAGNOSIS — R110 Nausea: Secondary | ICD-10-CM | POA: Diagnosis not present

## 2014-06-26 DIAGNOSIS — E785 Hyperlipidemia, unspecified: Secondary | ICD-10-CM | POA: Diagnosis not present

## 2014-06-26 HISTORY — PX: ESOPHAGOGASTRODUODENOSCOPY: SHX5428

## 2014-06-26 SURGERY — EGD (ESOPHAGOGASTRODUODENOSCOPY)
Anesthesia: Moderate Sedation

## 2014-06-26 MED ORDER — MIDAZOLAM HCL 5 MG/5ML IJ SOLN
INTRAMUSCULAR | Status: AC
  Filled 2014-06-26: qty 10

## 2014-06-26 MED ORDER — STERILE WATER FOR IRRIGATION IR SOLN
Status: DC | PRN
  Administered 2014-06-26: 12:00:00

## 2014-06-26 MED ORDER — LIDOCAINE VISCOUS 2 % MT SOLN
OROMUCOSAL | Status: DC | PRN
  Administered 2014-06-26: 3 mL via OROMUCOSAL

## 2014-06-26 MED ORDER — SODIUM CHLORIDE 0.9 % IV SOLN
INTRAVENOUS | Status: DC
  Administered 2014-06-26: 1000 mL via INTRAVENOUS

## 2014-06-26 MED ORDER — LIDOCAINE VISCOUS 2 % MT SOLN
OROMUCOSAL | Status: AC
  Filled 2014-06-26: qty 15

## 2014-06-26 MED ORDER — MEPERIDINE HCL 100 MG/ML IJ SOLN
INTRAMUSCULAR | Status: DC | PRN
  Administered 2014-06-26: 25 mg via INTRAVENOUS
  Administered 2014-06-26: 50 mg via INTRAVENOUS

## 2014-06-26 MED ORDER — MEPERIDINE HCL 100 MG/ML IJ SOLN
INTRAMUSCULAR | Status: AC
  Filled 2014-06-26: qty 2

## 2014-06-26 MED ORDER — MIDAZOLAM HCL 5 MG/5ML IJ SOLN
INTRAMUSCULAR | Status: DC | PRN
  Administered 2014-06-26: 1 mg via INTRAVENOUS
  Administered 2014-06-26 (×2): 2 mg via INTRAVENOUS

## 2014-06-26 NOTE — Discharge Instructions (Signed)
YOUR UPPER ABDOMINAL PAIN & NAUSEA ARE MOST LIKELY DUE TO gastritis & REFLUX. I biopsied your stomach AND SMALL BOWEL.   STRICTLY FOLLOW A LOW FAT DIET. SEE INFO BELOW.  AVOID TRIGGERS FOR REFLUX. SEE INFO BELOW.  TAKE DEXILANT WITH YOUR FIRST MEAL.  USE ZOFRAN AS NEEDED FOR NAUSEA.  YOUR TEST RESULTS WILL BE BACK IN 14 DAYS OR YOU CAN LOOK THEM UP ON MY CHART AFTER FEB 3.  FOLLOW UP IN APR 2016.     UPPER ENDOSCOPY AFTER CARE Read the instructions outlined below and refer to this sheet in the next week. These discharge instructions provide you with general information on caring for yourself after you leave the hospital. While your treatment has been planned according to the most current medical practices available, unavoidable complications occasionally occur. If you have any problems or questions after discharge, call DR. Cleburne Savini, [phone].  ACTIVITY  You may resume your regular activity, but move at a slower pace for the next 24 hours.   Take frequent rest periods for the next 24 hours.   Walking will help get rid of the air and reduce the bloated feeling in your belly (abdomen).   No driving for 24 hours (because of the medicine (anesthesia) used during the test).   You may shower.   Do not sign any important legal documents or operate any machinery for 24 hours (because of the anesthesia used during the test).    NUTRITION  Drink plenty of fluids.   You may resume your normal diet as instructed by your doctor.   Begin with a light meal and progress to your normal diet. Heavy or fried foods are harder to digest and may make you feel sick to your stomach (nauseated).   Avoid alcoholic beverages for 24 hours or as instructed.    MEDICATIONS  You may resume your normal medications.   WHAT YOU CAN EXPECT TODAY  Some feelings of bloating in the abdomen.   Passage of more gas than usual.    IF YOU HAD A BIOPSY TAKEN DURING THE UPPER ENDOSCOPY:  Eat a soft  diet IF YOU HAVE NAUSEA, BLOATING, ABDOMINAL PAIN, OR VOMITING.    FINDING OUT THE RESULTS OF YOUR TEST Not all test results are available during your visit. DR. Oneida Alar WILL CALL YOU WITHIN 14 DAYS OF YOUR PROCEDUE WITH YOUR RESULTS. Do not assume everything is normal if you have not heard from DR. Inocente Krach, CALL HER OFFICE AT [phone].  SEEK IMMEDIATE MEDICAL ATTENTION AND CALL THE OFFICE: [phone] IF:  You have more than a spotting of blood in your stool.   Your belly is swollen (abdominal distention).   You are nauseated or vomiting.   You have a temperature over 101F.   You have abdominal pain or discomfort that is severe or gets worse throughout the day.    Lifestyle and home remedies TO HELP CONTROL REFLUX AND NAUSEA  You may eliminate or reduce the frequency of heartburn by making the following lifestyle changes:   Control your weight. Being overweight is a major risk factor for heartburn and GERD. Excess pounds put pressure on your abdomen, pushing up your stomach and causing acid to back up into your esophagus.    Eat smaller meals. 4 TO 6 MEALS A DAY. This reduces pressure on the lower esophageal sphincter, helping to prevent the valve from opening and acid from washing back into your esophagus.    Loosen your belt. Clothes that fit tightly around your  waist put pressure on your abdomen and the lower esophageal sphincter.     Eliminate triggers. Everyone has specific triggers.     Common triggers such as fatty or fried foods, spicy food, tomato sauce, carbonated beverages, alcohol, chocolate, mint, garlic, onion, caffeine and nicotine may make heartburn worse.    Avoid stooping or bending. Tying your shoes is OK. Bending over for longer periods to weed your garden isn't, especially soon after eating.    Don't lie down after a meal. Wait at least three to four hours after eating before going to bed, and don't lie down right after eating.    PLACE THE HEAD  OF YOUR BED ON 6 INCH BLOCKS.  Alternative medicine  Several home remedies exist for treating GERD, but they provide only temporary relief. They include drinking baking soda (sodium bicarbonate) added to water or drinking other fluids such as baking soda mixed with cream of tartar and water.  Although these liquids create temporary relief by neutralizing, washing away or buffering acids, eventually they aggravate the situation by adding gas and fluid to your stomach, increasing pressure and causing more acid reflux. Further, adding more sodium to your diet may increase your blood pressure and add stress to your heart, and excessive bicarbonate ingestion can alter the acid-base balance in your body.   REFLUX   TREATMENT There are a number of medicines used to treat reflux including: Antacids.  ZANTAC Proton-pump inhibitors: DEXILANT  HOME CARE INSTRUCTIONS Eat 2-3 hours before going to bed.  Try to reach and maintain a healthy weight. LOSE 10-20 LBS Do not eat just a few very large meals. Instead, eat 4 TO 6 smaller meals throughout the day.  Try to identify foods and beverages that make your symptoms worse, and avoid these.  Avoid tight clothing.  Do not exercise right after eating.   Low-Fat Diet BREADS, CEREALS, PASTA, RICE, DRIED PEAS, AND BEANS These products are high in carbohydrates and most are low in fat. Therefore, they can be increased in the diet as substitutes for fatty foods. They too, however, contain calories and should not be eaten in excess. Cereals can be eaten for snacks as well as for breakfast.  Include foods that contain fiber (fruits, vegetables, whole grains, and legumes). Research shows that fiber may lower blood cholesterol levels, especially the water-soluble fiber found in fruits, vegetables, oat products, and legumes. FRUITS AND VEGETABLES It is good to eat fruits and vegetables. Besides being sources of fiber, both are rich in vitamins and some minerals.  They help you get the daily allowances of these nutrients. Fruits and vegetables can be used for snacks and desserts. MEATS Limit lean meat, chicken, [place], and fish to no more than 6 ounces per day. Beef, Pork, and Lamb Use lean cuts of beef, pork, and lamb. Lean cuts include:  Extra-lean ground beef.  Arm roast.  Sirloin tip.  Center-cut ham.  Round steak.  Loin chops.  Rump roast.  Tenderloin.  Trim all fat off the outside of meats before cooking. It is not necessary to severely decrease the intake of red meat, but lean choices should be made. Lean meat is rich in protein and contains a highly absorbable form of iron. Premenopausal women, in particular, should avoid reducing lean red meat because this could increase the risk for low red blood cells (iron-deficiency anemia).  Chicken and [place] These are good sources of protein. The fat of poultry can be reduced by removing the skin and underlying fat  layers before cooking. Chicken and [place] can be substituted for lean red meat in the diet. Poultry should not be fried or covered with high-fat sauces. Fish and Shellfish Fish is a good source of protein. Shellfish contain cholesterol, but they usually are low in saturated fatty acids. The preparation of fish is important. Like chicken and [place], they should not be fried or covered with high-fat sauces. EGGS Egg whites contain no fat or cholesterol. They can be eaten often. Try 1 to 2 egg whites instead of whole eggs in recipes or use egg substitutes that do not contain yolk.  MILK AND DAIRY PRODUCTS Use skim or 1% milk instead of 2% or whole milk. Decrease whole milk, natural, and processed cheeses. Use nonfat or low-fat (2%) cottage cheese or low-fat cheeses made from vegetable oils. Choose nonfat or low-fat (1 to 2%) yogurt. Experiment with evaporated skim milk in recipes that call for heavy cream. Substitute low-fat yogurt or low-fat cottage cheese for sour cream in dips and salad  dressings. Have at least 2 servings of low-fat dairy products, such as 2 glasses of skim (or 1%) milk each day to help get your daily calcium intake.  FATS AND OILS Butterfat, lard, and beef fats are high in saturated fat and cholesterol. These should be avoided.Vegetable fats do not contain cholesterol. AVOID coconut oil, palm oil, and palm kernel oil, WHICH are very high in saturated fats. These should be limited. These fats are often used in bakery goods, processed foods, popcorn, oils, and nondairy creamers. Vegetable shortenings and some peanut butters contain hydrogenated oils, which are also saturated fats. Read the labels on these foods and check for saturated vegetable oils.  Desirable liquid vegetable oils are corn oil, cottonseed oil, olive oil, canola oil, safflower oil, soybean oil, and sunflower oil. Peanut oil is not as good, but small amounts are acceptable. Buy a heart-healthy tub margarine that has no partially hydrogenated oils in the ingredients. AVOID Mayonnaise and salad dressings often are made from unsaturated fats.  OTHER EATING TIPS Snacks  Most sweets should be limited as snacks. They tend to be rich in calories and fats, and their caloric content outweighs their nutritional value. Some good choices in snacks are graham crackers, melba toast, soda crackers, bagels (no egg), English muffins, fruits, and vegetables. These snacks are preferable to snack crackers, [place] fries, and chips. Popcorn should be air-popped or cooked in small amounts of liquid vegetable oil.  Desserts Eat fruit, low-fat yogurt, and fruit ices instead of pastries, cake, and cookies. Sherbet, angel food cake, gelatin dessert, frozen low-fat yogurt, or other frozen products that do not contain saturated fat (pure fruit juice bars, frozen ice pops) are also acceptable.   COOKING METHODS Choose those methods that use little or no fat. They include: Poaching.  Braising.  Steaming.  Grilling.  Baking.    Stir-frying.  Broiling.  Microwaving.  Foods can be cooked in a nonstick pan without added fat, or use a nonfat cooking spray in regular cookware. Limit fried foods and avoid frying in saturated fat. Add moisture to lean meats by using water, broth, cooking wines, and other nonfat or low-fat sauces along with the cooking methods mentioned above. Soups and stews should be chilled after cooking. The fat that forms on top after a few hours in the refrigerator should be skimmed off. When preparing meals, avoid using excess salt. Salt can contribute to raising blood pressure in some people.  EATING AWAY FROM HOME Order entres, potatoes, and  vegetables without sauces or butter. When meat exceeds the size of a deck of cards (3 to 4 ounces), the rest can be taken home for another meal. Choose vegetable or fruit salads and ask for low-calorie salad dressings to be served on the side. Use dressings sparingly. Limit high-fat toppings, such as bacon, crumbled eggs, cheese, sunflower seeds, and olives. Ask for heart-healthy tub margarine instead of butter.  REFLUX SYMPTOMS Common symptoms of GERD are heartburn (burning in your chest). This is worse when lying down or bending over. It may also cause belching and indigestion. Some of the things which make GERD worse are:  Increased weight pushes on stomach making acid rise more easily.   Smoking markedly increases acid production.   Alcohol decreases lower esophageal sphincter pressure (valve between stomach and esophagus), allowing acid from stomach into esophagus.   Late evening meals and going to bed with a full stomach increases pressure.   Anything that causes an increase in acid production.   HOME CARE INSTRUCTIONS  Try to achieve and maintain an ideal body weight.   Avoid drinking alcoholic beverages.   DO NOT smokE.   Do not wear tight clothing around your chest or stomach.   Eat smaller meals and eat more frequently. This keeps your  stomach from getting too full. Eat slowly.   Do not lie down for 2 or 3 hours after eating. Do not eat or drink anything 1 to 2 hours before going to bed.   Avoid caffeine beverages (colas, coffee, cocoa, tea), fatty foods, citrus fruits and all other foods and drinks that contain acid and that seem to increase the problems.   Avoid bending over, especially after eating OR STRAINING. Anything that increases the pressure in your belly increases the amount of acid that may be pushed up into your esophagus.

## 2014-06-26 NOTE — Interval H&P Note (Signed)
History and Physical Interval Note:  06/26/2014 11:34 AM  Charles [name]  has presented today for surgery, with the diagnosis of nausea/abd pain/dyspepsia  The various methods of treatment have been discussed with the patient and family. After consideration of risks, benefits and other options for treatment, the patient has consented to  Procedure(s) with comments: ESOPHAGOGASTRODUODENOSCOPY (EGD) (N/A) - 200 - moved to 12:00 - Ginger notified pt as a surgical intervention .  The patient's history has been reviewed, patient examined, no change in status, stable for surgery.  I have reviewed the patient's chart and labs.  Questions were answered to the patient's satisfaction.     [employer]

## 2014-06-26 NOTE — H&P (View-Only) (Signed)
   Subjective:    Patient ID: Charles [name], male    DOB: 04-05-1966, 49 y.o.   MRN: [id number]  Charles Bogus, MD   HPI NAUSEA AND ABDOMINAL PAIN. [place] DIDN'T HELP. SX WERE A LITTLE BETTER AFTER EGD/TCS BUT THEN THEY GOT WORSE. NO CHANGE IN DIET. NO ETOH: 2 MOS. NO VOMITING. 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 NO WORSE WITH LIFTING. HERNIA OPERATION FOR GROIN PAIN. MAY BE ASSOCIATED WITH CONSTIPATION OR DIARRHEA. EATS FAIR AMOUNT OF SPICY OR FRIED FOOD. RARE ADVIL. NO ASPIRIN, BC/GOODY POWDERS, OR NAPROXEN/ALEVE. TRIGGERS: WAKES UP AND 5-10 MIN SLATER PAIN/NAUSEA STARTS. CAN WAKE HIM UP IN THE MIDDLE OF THE NIGHT. USED TO BE EATING HELPED BUT NOT NOW. PAIN SI DULL AND DURING MAY BECOME SHARP INTENSE(3-4X/DAY). NAUSEA AND ABD PAIN MAY OCCUR ALONE OR TOGETHER. ALWAYS STARTS IN EPIGASTRIUM AND MOVES TO LUQ. BMs: 3X/DAY(#3-4). PAIN WAS OFF AND AND NOW IT'S ALL DAY EVERY DAY. NAUSEA IS THE WORSE PART. MIGRAINES BAD. VISION DECREASED. NO PAIN IN FOREHEAD OR CHEEKS, AND/PND, RINGING IN EARS,  EAR PAIN, SORE THROAT, OR PROBLEMS WITH BALANCE OR WALKING. GAINED 3 LBS SINCE NOV 2015.   PT DENIES FEVER, CHILLS, HEMATOCHEZIA, melena, CHEST PAIN, SHORTNESS OF BREATH,  constipation, problems swallowing, problems with sedation, OR heartburn or indigestion.  Past Medical History  Diagnosis Date  . Abdominal pain   . Headache(784.0)   . Hyperlipidemia   . Constipation   . GERD (gastroesophageal reflux disease)    Past Surgical History  Procedure Laterality Date  . Colonoscopy  03/05/2012    JOA:CZYSA internal hemorrhoids/Two sessile polyps HYPERPLASTIC  . Esophagogastroduodenoscopy  03/05/2012    SLF:Non-erosive gastritis (inflammation), negative H.pylori, s/p empiric Savary dilation  . Inguinal hernia repair Right 05/09/2013    Procedure: HERNIA REPAIR INGUINAL ADULT;  Surgeon: Jamesetta So, MD;  Location: [facility];  Service: General;  Laterality: Right;   No  Known Allergies  Current Outpatient Prescriptions  Medication Sig Dispense Refill  . dexlansoprazole (DEXILANT) 60 MG capsule Take 1 capsule (60 mg total) by mouth daily. FOR 1-2 MOS   . Multiple Vitamin (MULTIVITAMIN WITH MINERALS) TABS tablet Take 1 tablet by mouth daily.      Review of Systems FAINTED AFTER HIDA SCAN    Objective:   Physical Exam  Constitutional: He is oriented to person, place, and time. He appears well-developed and well-nourished. No distress.  HENT:  Head: Normocephalic and atraumatic.  Mouth/Throat: Oropharynx is clear and moist. No oropharyngeal exudate.  Eyes: Pupils are equal, round, and reactive to light. No scleral icterus.  Neck: Normal range of motion. Neck supple.  Cardiovascular: Normal rate, regular rhythm and normal heart sounds.   Pulmonary/Chest: Effort normal and breath sounds normal. No respiratory distress.  Abdominal: Soft. Bowel sounds are normal. He exhibits no distension. There is tenderness. There is no rebound and no guarding.  MILD TTP IN THE EPIGASTRIUM   Musculoskeletal: He exhibits no edema.  Lymphadenopathy:    He has no cervical adenopathy.  Neurological: He is alert and oriented to person, place, and time.  NO FOCAL DEFICITS   Psychiatric:  FLAT AFFECT, SLIGHTLY ANXIOUS MOOD   Vitals reviewed.         Assessment & Plan:

## 2014-06-26 NOTE — Op Note (Signed)
[facility] [address] [place], [address]   ENDOSCOPY PROCEDURE REPORT  PATIENT: Charles [name], Charles [name]  MR#: [id number] BIRTHDATE: 1965/10/15 , 20  yrs. old GENDER: male  ENDOSCOPIST: Danie Binder, MD REFERRED BY:  PROCEDURE DATE: 2014/07/06 PROCEDURE:   EGD w/ biopsy  INDICATIONS:dyspepsia. MEDICATIONS: Demerol 75 mg IV and Versed 5 mg IV TOPICAL ANESTHETIC:   Viscous Xylocaine ASA CLASS:  DESCRIPTION OF PROCEDURE:     Physical exam was performed.  Informed consent was obtained from the patient after explaining the benefits, risks, and alternatives to the procedure.  The patient was connected to the monitor and placed in the left lateral position.  Continuous oxygen was provided by nasal cannula and IV medicine administered through an indwelling cannula.  After administration of sedation, the patients esophagus was intubated and the EG-[id number] ([id number])  endoscope was advanced under direct visualization to the second portion of the duodenum.  The scope was removed slowly by carefully examining the color, texture, anatomy, and integrity of the mucosa on the way out.  The patient was recovered in endoscopy and discharged home in satisfactory condition.   ESOPHAGUS: The mucosa of the esophagus appeared normal.   STOMACH: Mild non-erosive gastritis (inflammation) was found in the gastric antrum.  Multiple biopsies were performed using cold forceps. DUODENUM: The duodenal mucosa showed no abnormalities in the bulb and 2nd part of the duodenum.  Cold forceps biopsies were taken in the bulb and second portion.        COMPLICATIONS: There were no immediate complications.  ENDOSCOPIC IMPRESSION: 1.   UPPER ABDOMINAL PAIN & NAUSEA ARE MOST LIKELY DUE TO gastritis & REFLUX.  RECOMMENDATIONS: STRICTLY FOLLOW A LOW FAT DIET. AVOID TRIGGERS FOR REFLUX. TAKE DEXILANT WITH YOUR FIRST MEAL. USE ZOFRAN AS NEEDED FOR NAUSEA. AWAIT BIOPSY. CONSIDER EUS IF NO H PYLORI  GASTRITIS. FOLLOW UP IN APR 2016.  REPEAT EXAM: eSigned:  Danie Binder, MD 07-06-14 3:55 PM     CPT CODES: ICD CODES:  The ICD and CPT codes recommended by this software are interpretations from the data that the clinical staff has captured with the software.  The verification of the translation of this report to the ICD and CPT codes and modifiers is the sole responsibility of the health care institution and practicing physician where this report was generated.  [place]. will not be held responsible for the validity of the ICD and CPT codes included on this report.  AMA assumes no liability for data contained or not contained herein. CPT is a Designer, television/film set of the [place] NAME:  Arhum, Peeples MR#: [id number]

## 2014-06-29 ENCOUNTER — Encounter (HOSPITAL_COMMUNITY): Payer: Self-pay | Admitting: Gastroenterology

## 2014-06-30 ENCOUNTER — Telehealth: Payer: Self-pay

## 2014-06-30 NOTE — Telephone Encounter (Signed)
LMOM for a return call again.

## 2014-06-30 NOTE — Progress Notes (Signed)
Quick Note:  Pt returned call and was informed. He said he is still having the same problems he had before he came in.  His stomach hurts almost every day. He is having a lot of nausea. The Zofran did work, but the last couple of days it has not worked as well.  He has been constipated for a couple of days. He said he has been having these problems for 2 years and had all kinds of tests and he just does not know what his problem can be. ______

## 2014-06-30 NOTE — Telephone Encounter (Signed)
PATIENT RETURNED CALL, PLEASE CALL BACK  °

## 2014-06-30 NOTE — Progress Notes (Signed)
Quick Note:  LMOM for a return call. ______ 

## 2014-06-30 NOTE — Progress Notes (Signed)
Quick Note:  HIDA scan is normal. Normal EF at 95%.  How is patient doing? ______

## 2014-06-30 NOTE — Telephone Encounter (Signed)
See result note.  

## 2014-07-02 ENCOUNTER — Telehealth: Payer: Self-pay | Admitting: Gastroenterology

## 2014-07-02 NOTE — Telephone Encounter (Signed)
PATH REPORT SHOWS EOSINOPHILIC DUODENITIS. HE NEEDS PREDNISONE 20 MG FOR 2 WEEKS THEN 10 MG QD FOR 7 DAYS THEN 5 MG QD FOR 7 DAYS. HIS GASTRIC EMPTYING STUDY IS NORMAL.

## 2014-07-02 NOTE — Progress Notes (Signed)
Quick Note:  Noted. Looks like he just had an EGD with Dr. Oneida Alar. Would await final recommendations from her regarding path reports. ______

## 2014-07-07 NOTE — Telephone Encounter (Signed)
PT called and is aware of results. Prednisone called to [place] to [place].

## 2014-07-07 NOTE — Telephone Encounter (Signed)
Called patient TO DISCUSS RESULTS. LVM TO DISCUSS [phone].

## 2014-07-08 ENCOUNTER — Telehealth: Payer: Self-pay | Admitting: Gastroenterology

## 2014-07-08 NOTE — Telephone Encounter (Signed)
Called patient TO DISCUSS CONCERNS. LVM-CALL [phone] TO DISCUSS.  

## 2014-07-08 NOTE — Telephone Encounter (Signed)
Patient was returning call to [facility]. I told him SF hasn't returned to the office yet, but I would let her know that he had called. He asked for her to call him when she has a moment. [phone]

## 2014-07-15 MED ORDER — ONDANSETRON 4 MG PO TBDP: ORAL_TABLET | ORAL | Status: DC

## 2014-07-15 NOTE — Telephone Encounter (Addendum)
QUESTIONS ABOUT NEED FOR DEXILANT. FEELS BETTER SINCE STARTING STEROIDS. WOKE THIS AM SORT OF FELT SICK. 2 BMs THIS AM. EXPLAINED STEROIDS MAY INCREASE HIS RISK OF INFECTION.   MAY HOLD DEXILANT. CAL IF HE NEEDS MORE REFILLS ON DEXILANT. RX FOR ZOFRAN SENT. LVM OR PT TO PICK UP IF HE NEEDS IT.   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

## 2014-07-15 NOTE — Telephone Encounter (Signed)
Called patient TO DISCUSS RESULTS. LVM-CALL [phone] OR [phone] TO ADDRESS QUESTIONS.

## 2014-07-15 NOTE — Addendum Note (Signed)
Addended by: Danie Binder on: 07/15/2014 12:54 PM   Modules accepted: Orders

## 2014-07-31 ENCOUNTER — Encounter (HOSPITAL_COMMUNITY): Payer: Self-pay | Admitting: Emergency Medicine

## 2014-07-31 ENCOUNTER — Emergency Department (HOSPITAL_COMMUNITY)
Admission: EM | Admit: 2014-07-31 | Discharge: 2014-07-31 | Disposition: A | Discharge status: Home / Self Care | Payer: Managed Care, Other (non HMO) | Attending: Emergency Medicine | Admitting: Emergency Medicine

## 2014-07-31 DIAGNOSIS — M5430 Sciatica, unspecified side: Secondary | ICD-10-CM | POA: Diagnosis not present

## 2014-07-31 DIAGNOSIS — G8929 Other chronic pain: Secondary | ICD-10-CM | POA: Insufficient documentation

## 2014-07-31 DIAGNOSIS — Y9289 Other specified places as the place of occurrence of the external cause: Secondary | ICD-10-CM | POA: Diagnosis not present

## 2014-07-31 DIAGNOSIS — Y9389 Activity, other specified: Secondary | ICD-10-CM | POA: Insufficient documentation

## 2014-07-31 DIAGNOSIS — K219 Gastro-esophageal reflux disease without esophagitis: Secondary | ICD-10-CM | POA: Insufficient documentation

## 2014-07-31 DIAGNOSIS — Z79899 Other long term (current) drug therapy: Secondary | ICD-10-CM | POA: Insufficient documentation

## 2014-07-31 DIAGNOSIS — Z8639 Personal history of other endocrine, nutritional and metabolic disease: Secondary | ICD-10-CM | POA: Diagnosis not present

## 2014-07-31 DIAGNOSIS — X58XXXA Exposure to other specified factors, initial encounter: Secondary | ICD-10-CM | POA: Insufficient documentation

## 2014-07-31 DIAGNOSIS — Y990 Civilian activity done for income or pay: Secondary | ICD-10-CM | POA: Diagnosis not present

## 2014-07-31 DIAGNOSIS — S3992XA Unspecified injury of lower back, initial encounter: Secondary | ICD-10-CM | POA: Diagnosis present

## 2014-07-31 MED ORDER — CYCLOBENZAPRINE HCL 5 MG PO TABS: 5.0000 mg | ORAL_TABLET | Freq: Three times a day (TID) | ORAL | Status: DC | PRN

## 2014-07-31 MED ORDER — PREDNISONE 10 MG PO TABS: ORAL_TABLET | ORAL | Status: DC

## 2014-07-31 MED ORDER — HYDROCODONE-ACETAMINOPHEN 5-325 MG PO TABS: 1.0000 | ORAL_TABLET | ORAL | Status: DC | PRN

## 2014-07-31 NOTE — ED Notes (Signed)
Low back pain for 2-3 days, worse today after picking up something heavy at work.  Pain radiates down both legs to knees.  No problem with incontinence.

## 2014-07-31 NOTE — ED Notes (Signed)
Patient complaining of lower right back pain. States "I felt something pop when I picked up something heavy at work today."

## 2014-07-31 NOTE — Discharge Instructions (Signed)
Sciatica °Sciatica is pain, weakness, numbness, or tingling along the path of the sciatic nerve. The nerve starts in the lower back and runs down the back of each leg. The nerve controls the muscles in the lower leg and in the back of the knee, while also providing sensation to the back of the thigh, lower leg, and the sole of your foot. Sciatica is a symptom of another medical condition. For instance, nerve damage or certain conditions, such as a herniated disk or bone spur on the spine, pinch or put pressure on the sciatic nerve. This causes the pain, weakness, or other sensations normally associated with sciatica. Generally, sciatica only affects one side of the body. °CAUSES  °· Herniated or slipped disc. °· Degenerative disk disease. °· A pain disorder involving the narrow muscle in the buttocks (piriformis syndrome). °· Pelvic injury or fracture. °· Pregnancy. °· Tumor (rare). °SYMPTOMS  °Symptoms can vary from mild to very severe. The symptoms usually travel from the low back to the buttocks and down the back of the leg. Symptoms can include: °· Mild tingling or dull aches in the lower back, leg, or hip. °· Numbness in the back of the calf or sole of the foot. °· Burning sensations in the lower back, leg, or hip. °· Sharp pains in the lower back, leg, or hip. °· Leg weakness. °· Severe back pain inhibiting movement. °These symptoms may get worse with coughing, sneezing, laughing, or prolonged sitting or standing. Also, being overweight may worsen symptoms. °DIAGNOSIS  °Your caregiver will perform a physical exam to look for common symptoms of sciatica. He or she may ask you to do certain movements or activities that would trigger sciatic nerve pain. Other tests may be performed to find the cause of the sciatica. These may include: °· Blood tests. °· X-rays. °· Imaging tests, such as an MRI or CT scan. °TREATMENT  °Treatment is directed at the cause of the sciatic pain. Sometimes, treatment is not necessary  and the pain and discomfort goes away on its own. If treatment is needed, your caregiver may suggest: °· Over-the-counter medicines to relieve pain. °· Prescription medicines, such as anti-inflammatory medicine, muscle relaxants, or narcotics. °· Applying heat or ice to the painful area. °· Steroid injections to lessen pain, irritation, and inflammation around the nerve. °· Reducing activity during periods of pain. °· Exercising and stretching to strengthen your abdomen and improve flexibility of your spine. Your caregiver may suggest losing weight if the extra weight makes the back pain worse. °· Physical therapy. °· Surgery to eliminate what is pressing or pinching the nerve, such as a bone spur or part of a herniated disk. °HOME CARE INSTRUCTIONS  °· Only take over-the-counter or prescription medicines for pain or discomfort as directed by your caregiver. °· Apply ice to the affected area for 20 minutes, 3-4 times a day for the first 48-72 hours. Then try heat in the same way. °· Exercise, stretch, or perform your usual activities if these do not aggravate your pain. °· Attend physical therapy sessions as directed by your caregiver. °· Keep all follow-up appointments as directed by your caregiver. °· Do not wear high heels or shoes that do not provide proper support. °· Check your mattress to see if it is too soft. A firm mattress may lessen your pain and discomfort. °SEEK IMMEDIATE MEDICAL CARE IF:  °· You lose control of your bowel or bladder (incontinence). °· You have increasing weakness in the lower back, pelvis, buttocks,   or legs.  You have redness or swelling of your back.  You have a burning sensation when you urinate.  You have pain that gets worse when you lie down or awakens you at night.  Your pain is worse than you have experienced in the past.  Your pain is lasting longer than 4 weeks.  You are suddenly losing weight without reason. MAKE SURE YOU:  Understand these  instructions.  Will watch your condition.  Will get help right away if you are not doing well or get worse. Document Released: 05/09/2001 Document Revised: 11/14/2011 Document Reviewed: 09/24/2011 [facility] Patient Information 2015 [place], [place]. This information is not intended to replace advice given to you by your health care provider. Make sure you discuss any questions you have with your health care provider.   Use the medicines as instructed.  Do not drive within 4 hours of taking hydrocodone as this will make you drowsy.  Avoid lifting,  Bending,  Twisting or any other activity that worsens your pain over the next week.  Apply an  icepack  to your lower back for 10-15 minutes every 2 hours for the next 2 days.  You should get rechecked if your symptoms are not better over the next 5 days,  Or you develop increased pain,  Weakness in your leg(s) or loss of bladder or bowel function - these are symptoms of a worse injury.

## 2014-08-02 NOTE — ED Provider Notes (Signed)
CSN: [id number]     Arrival date & time 07/31/14  1123 History   First MD Initiated Contact with Patient 07/31/14 1208     Chief Complaint  Patient presents with  . Back Pain     (Consider location/radiation/quality/duration/timing/severity/associated sxs/prior Treatment) The history is provided by the patient.   Charles [name] is a 49 y.o. 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  He has occasional similar problems with radation of symptoms into his left leg, todays presentation including radiation into both legs to the posterior mid thigh.   There has been no weakness or numbness in the lower extremities and no urinary or bowel retention or incontinence.  Patient does not have a history of cancer or IVDU.  The patient has tried tylenol and motrin without significant relief of symptoms.      Past Medical History  Diagnosis Date  . Abdominal pain   . Headache(784.0)   . Hyperlipidemia   . Constipation   . GERD (gastroesophageal reflux disease)    Past Surgical History  Procedure Laterality Date  . Colonoscopy  03/05/2012    GEZ:MOQHU internal hemorrhoids/Two sessile polyps HYPERPLASTIC  . Esophagogastroduodenoscopy  03/05/2012    SLF:Non-erosive gastritis (inflammation), negative H.pylori, s/p empiric Savary dilation  . Inguinal hernia repair Right 05/09/2013    Procedure: HERNIA REPAIR INGUINAL ADULT;  Surgeon: Jamesetta So, MD;  Location: [facility];  Service: General;  Laterality: Right;  . Esophagogastroduodenoscopy N/A 06/26/2014    Procedure: ESOPHAGOGASTRODUODENOSCOPY (EGD);  Surgeon: Danie Binder, MD;  Location: [facility];  Service: Endoscopy;  Laterality: N/A;  200 - moved to 12:00 - Ginger notified pt   Family History  Problem Relation Age of Onset  . Colon cancer Neg Hx   . Inflammatory  bowel disease Neg Hx   . Liver disease Neg Hx    History  Substance Use Topics  . Smoking status: Never Smoker   . Smokeless tobacco: Not on file  . Alcohol Use: 3.0 oz/week    5 Standard drinks or equivalent per week     Comment: vodka, twice per week, 1 cup in two mixed drinks. (as of 11/11 no etoh in about a month)    Review of Systems  Constitutional: Negative for fever.  Respiratory: Negative for shortness of breath.   Cardiovascular: Negative for chest pain and leg swelling.  Gastrointestinal: Negative for abdominal pain, constipation and abdominal distention.  Genitourinary: Negative for dysuria, urgency, frequency, flank pain and difficulty urinating.  Musculoskeletal: Positive for back pain. Negative for joint swelling and gait problem.  Skin: Negative for rash.  Neurological: Negative for weakness and numbness.      Allergies  Review of patient's allergies indicates no known allergies.  Home Medications   Prior to Admission medications   Medication Sig Start Date End Date Taking? Authorizing Provider  Multiple Vitamin (MULTIVITAMIN WITH MINERALS) TABS tablet Take 1 tablet by mouth daily.   Yes Historical Provider, MD  cyclobenzaprine (FLEXERIL) 5 MG tablet Take 1 tablet (5 mg total) by mouth 3 (three) times daily as needed for muscle spasms. 07/31/14   Evalee Jefferson, PA-C  dexlansoprazole (DEXILANT) 60 MG capsule Take 1 capsule (60 mg total) by mouth daily. Patient not taking: Reported on 07/31/2014 04/08/14   Orvil Feil, NP  HYDROcodone-acetaminophen (NORCO/VICODIN)  5-325 MG per tablet Take 1 tablet by mouth every 4 (four) hours as needed. 07/31/14   Evalee Jefferson, PA-C  ondansetron (ZOFRAN ODT) 4 MG disintegrating tablet 1-2 SL 30 MINS PRIOR TO MEALS AND AT BEDTIME. MAY REPEAT Q4-6H PRN NAUSEA OR VOMITING Patient not taking: Reported on 07/31/2014 07/15/14   Danie Binder, MD  predniSONE (DELTASONE) 10 MG tablet 6, 5, 4, 3, 2 then 1 tablet by mouth daily for 6 days total. 07/31/14    Evalee Jefferson, PA-C   BP 154/96 mmHg  Pulse 80  Temp(Src) 98.9 F (37.2 C) (Oral)  Resp 14  Ht 6\' 3"  (1.905 m)  Wt 175 lb (79.379 kg)  BMI 21.87 kg/m2  SpO2 97% Physical Exam  Constitutional: He appears well-developed and well-nourished.  HENT:  Head: Normocephalic.  Eyes: Conjunctivae are normal.  Neck: Normal range of motion. Neck supple.  Cardiovascular: Normal rate and intact distal pulses.   Pedal pulses normal.  Pulmonary/Chest: Effort normal.  Abdominal: Soft. Bowel sounds are normal. He exhibits no distension and no mass.  Musculoskeletal: Normal range of motion. He exhibits no edema.       Lumbar back: He exhibits tenderness. He exhibits no swelling, no edema and no spasm.  Bilateral paralumbar ttp, no midline pain or deformity.  Neurological: He is alert. He has normal strength. He displays no atrophy and no tremor. No sensory deficit. Gait normal.  Reflex Scores:      Patellar reflexes are 2+ on the right side and 2+ on the left side.      Achilles reflexes are 2+ on the right side and 2+ on the left side. No strength deficit noted in hip and knee flexor and extensor muscle groups.  Ankle flexion and extension intact.  Skin: Skin is warm and dry.  Psychiatric: He has a normal mood and affect.  Nursing note and vitals reviewed.   ED Course  Procedures (including critical care time) Labs Review Labs Reviewed - No data to display  Imaging Review No results found.   EKG Interpretation None      MDM   Final diagnoses:  Sciatica neuralgia, unspecified laterality    Pt with infrequent episodes of sciatica.  [place] controlled substance database reviewed. Pt prescribed prednisone, hydrocodone, flexeril. Advised heat tx, activity as tolerated. F/u with pcp for worsened or persistent sx.  No neuro deficit on exam or by history to suggest emergent or surgical presentation.  Also discussed worsened sx that should prompt immediate re-evaluation including distal  weakness, bowel/bladder retention/incontinence.          Evalee Jefferson, PA-C 08/02/14 [place], MD 08/08/14 [phone]

## 2014-08-18 ENCOUNTER — Encounter: Payer: Self-pay | Admitting: Gastroenterology

## 2016-03-06 IMAGING — NM NM HEPATO W/GB/PHARM/[PERSON_NAME]
2 series · 12 of 12 positions shown · non-contrast
Comparison: None.

CLINICAL DATA: Abdominal pain and nausea for 2 years

EXAM:
NUCLEAR MEDICINE HEPATOBILIARY IMAGING WITH GALLBLADDER EF
TECHNIQUE: Sequential images of the abdomen were obtained [DATE] minutes
following intravenous administration of radiopharmaceutical. After
slow intravenous infusion of 1.59 micrograms Cholecystokinin,
gallbladder ejection fraction was determined.
RADIOPHARMACEUTICALS:  5.0 [place]-EEm Choletec

[Series 1: biliary · 3.25mm/px · 6 of 60 frames shown]
[frame 6/60]
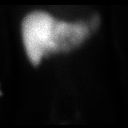
[frame 16/60]
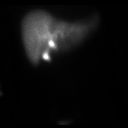
[frame 26/60]
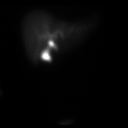
[frame 36/60]
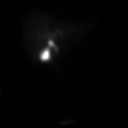
[frame 46/60]
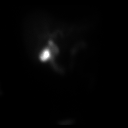
[frame 56/60]
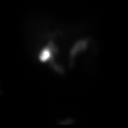

[Series 2: gbef · 3.25mm/px · 6 of 30 frames shown]
[frame 3/30]
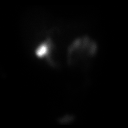
[frame 8/30]
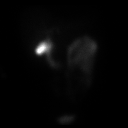
[frame 13/30]
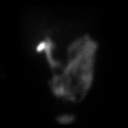
[frame 18/30]
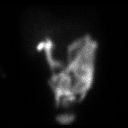
[frame 23/30]
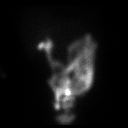
[frame 28/30]
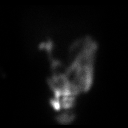

[12 of 12 positions shown; findings below may reference images not displayed]

FINDINGS: Gallbladder activity occurs at 15 min. Small bowel activity occurs
at 42 min. Gallbladder ejection fraction is 95% after 30 min. At 30
min, normal ejection fraction is greater than 30%.

The patient did not experience symptoms during CCK infusion.
IMPRESSION: Cystic and common bile ducts are patent. Normal gallbladder ejection
fraction.

## 2017-07-11 NOTE — Progress Notes (Signed)
REVIEWED-NO ADDITIONAL RECOMMENDATIONS. 

## 2020-11-30 ENCOUNTER — Emergency Department (HOSPITAL_BASED_OUTPATIENT_CLINIC_OR_DEPARTMENT_OTHER)
Admission: EM | Admit: 2020-11-30 | Discharge: 2020-11-30 | Disposition: A | Discharge status: Home / Self Care | Payer: Managed Care, Other (non HMO) | Attending: Emergency Medicine | Admitting: Emergency Medicine

## 2020-11-30 ENCOUNTER — Other Ambulatory Visit: Payer: Self-pay

## 2020-11-30 ENCOUNTER — Encounter (HOSPITAL_BASED_OUTPATIENT_CLINIC_OR_DEPARTMENT_OTHER): Payer: Self-pay | Admitting: Emergency Medicine

## 2020-11-30 DIAGNOSIS — R519 Headache, unspecified: Secondary | ICD-10-CM | POA: Diagnosis present

## 2020-11-30 DIAGNOSIS — U071 COVID-19: Secondary | ICD-10-CM | POA: Diagnosis not present

## 2020-11-30 LAB — RESP PANEL BY RT-PCR (FLU A&B, COVID) ARPGX2
Influenza A by PCR: NEGATIVE
Influenza B by PCR: NEGATIVE
SARS Coronavirus 2 by RT PCR: POSITIVE — AB

## 2020-11-30 MED ORDER — IBUPROFEN 400 MG PO TABS
600.0000 mg | ORAL_TABLET | Freq: Once | ORAL | Status: AC
  Administered 2020-11-30: 600 mg via ORAL
  Filled 2020-11-30: qty 1

## 2020-11-30 NOTE — ED Triage Notes (Signed)
Pt arrives to ED with c/o of headache x2 days. Pt reports the headache is constant and feels like "pressure". Pt does reports the occanional nausea today. Pt reports episode today of fever, chills, and diaphoresis. BP high at 784'O systolic.

## 2020-11-30 NOTE — ED Notes (Signed)
Patient is resting comfortably. 

## 2020-12-01 NOTE — ED Provider Notes (Signed)
[facility] DEPT Provider Note   CSN: [id number] Arrival date & time: 11/30/20  1521     History Chief Complaint  Patient presents with   Headache    Charles [name] is a 55 y.o. male.  55 year old male with past medical history below who presents with headache.  Yesterday, patient began having a pressure-like headache and this morning developed fevers at home.  He has had some nausea, chills, and sweats.  He reports mild body aches.  He has a slight occasional cough but no significant cough and no shortness of breath.  No sore throat, nasal congestion, vomiting, or diarrhea.  No sick contacts.  He took a Tylenol PM earlier in the day.  The history is provided by the patient.  Headache     Past Medical History:  Diagnosis Date   Abdominal pain    Constipation    GERD (gastroesophageal reflux disease)    Headache(784.0)    Hyperlipidemia     Patient Active Problem List   Diagnosis Date Noted   Dyspepsia    Nausea without vomiting 06/18/2014   Epigastric pain 01/31/2012   Bowel habit changes 01/31/2012   Left sided abdominal pain 01/31/2012    Past Surgical History:  Procedure Laterality Date   COLONOSCOPY  03/05/2012   [facility]:QPRFF internal hemorrhoids/Two sessile polyps HYPERPLASTIC   ESOPHAGOGASTRODUODENOSCOPY  03/05/2012   [facility]:Non-erosive gastritis (inflammation), negative H.pylori, s/p empiric Savary dilation   ESOPHAGOGASTRODUODENOSCOPY N/A 06/26/2014   Procedure: ESOPHAGOGASTRODUODENOSCOPY (EGD);  Surgeon: Danie Binder, MD;  Location: [facility];  Service: Endoscopy;  Laterality: N/A;  200 - moved to 12:00 - Ginger notified pt   INGUINAL HERNIA REPAIR Right 05/09/2013   Procedure: HERNIA REPAIR INGUINAL ADULT;  Surgeon: Jamesetta So, MD;  Location: [facility];  Service: General;  Laterality: Right;       Family History  Problem Relation Age of Onset   Colon cancer Neg Hx    Inflammatory bowel disease Neg Hx    Liver disease Neg Hx      Social History   Tobacco Use   Smoking status: Never  Substance Use Topics   Alcohol use: Yes    Alcohol/week: 5.0 standard drinks    Types: 5 Standard drinks or equivalent per week    Comment: vodka, twice per week, 1 cup in two mixed drinks. (as of 11/11 no etoh in about a month)   Drug use: No    Home Medications Prior to Admission medications   Medication Sig Start Date End Date Taking? Authorizing Provider  cyclobenzaprine (FLEXERIL) 5 MG tablet Take 1 tablet (5 mg total) by mouth 3 (three) times daily as needed for muscle spasms. 07/31/14   Evalee Jefferson, PA-C  dexlansoprazole (DEXILANT) 60 MG capsule Take 1 capsule (60 mg total) by mouth daily. Patient not taking: Reported on 07/31/2014 04/08/14   Annitta Needs, NP  HYDROcodone-acetaminophen (NORCO/VICODIN) 5-325 MG per tablet Take 1 tablet by mouth every 4 (four) hours as needed. 07/31/14   Evalee Jefferson, PA-C  Multiple Vitamin (MULTIVITAMIN WITH MINERALS) TABS tablet Take 1 tablet by mouth daily.    [provider]  ondansetron (ZOFRAN ODT) 4 MG disintegrating tablet 1-2 SL 30 MINS PRIOR TO MEALS AND AT BEDTIME. MAY REPEAT Q4-6H PRN NAUSEA OR VOMITING Patient not taking: Reported on 07/31/2014 07/15/14   Danie Binder, MD  predniSONE (DELTASONE) 10 MG tablet 6, 5, 4, 3, 2 then 1 tablet by mouth daily for 6 days total. 07/31/14  Evalee Jefferson, PA-C    Allergies    Patient has no known allergies.  Review of Systems   Review of Systems  Neurological:  Positive for headaches.  All other systems reviewed and are negative except that which was mentioned in HPI  Physical Exam Updated Vital Signs BP (!) 148/108 (BP Location: Right Arm)   Pulse (!) 102   Temp 99.4 F (37.4 C) (Temporal)   Resp 16   Ht 6\' 3"  (1.905 m)   Wt 72.6 kg   SpO2 97%   BMI 20.00 kg/m   Physical Exam Constitutional:      General: He is not in acute distress.    Appearance: Normal appearance.  HENT:     Head: Normocephalic and atraumatic.   Eyes:     Extraocular Movements: Extraocular movements intact.     Conjunctiva/sclera: Conjunctivae normal.     Pupils: Pupils are equal, round, and reactive to light.  Cardiovascular:     Rate and Rhythm: Normal rate and regular rhythm.     Heart sounds: Normal heart sounds. No murmur heard. Pulmonary:     Effort: Pulmonary effort is normal.     Breath sounds: Normal breath sounds.  Abdominal:     General: Abdomen is flat. Bowel sounds are normal. There is no distension.     Palpations: Abdomen is soft.     Tenderness: There is no abdominal tenderness.  Musculoskeletal:     Right lower leg: No edema.     Left lower leg: No edema.  Skin:    General: Skin is warm and dry.  Neurological:     Mental Status: He is alert and oriented to person, place, and time.     Sensory: No sensory deficit.     Motor: No weakness.     Comments: fluent  Psychiatric:        Mood and Affect: Mood normal.        Behavior: Behavior normal.    ED Results / Procedures / Treatments   Labs (all labs ordered are listed, but only abnormal results are displayed) Labs Reviewed  RESP PANEL BY RT-PCR (FLU A&B, COVID) ARPGX2 - Abnormal; Notable for the following components:      Result Value   SARS Coronavirus 2 by RT PCR POSITIVE (*)    All other components within normal limits    EKG None  Radiology No results found.  Procedures Procedures   Medications Ordered in ED Medications  ibuprofen (ADVIL) tablet 600 mg (600 mg Oral Given 11/30/20 1820)    ED Course  I have reviewed the triage vital signs and the nursing notes.  Pertinent labs  that were available during my care of the patient were reviewed by me and considered in my medical decision making (see chart for details).    MDM Rules/Calculators/A&P                          Well-appearing on exam, hypertensive but otherwise reassuring vital signs.  No respiratory complaints.  COVID-19 positive which fits with the patient's symptoms.   He has no evidence of respiratory compromise or dehydration.  I have counseled on supportive measures, need for quarantine, and return precautions regarding COVID.  He voiced understanding.  ALBARAA SWINGLE was evaluated in Emergency Department on 12/01/2020 for the symptoms described in the history of present illness. He was evaluated in the context of the global COVID-19 pandemic, which necessitated consideration that the patient  might be at risk for infection with the SARS-CoV-2 virus that causes COVID-19. Institutional protocols and algorithms that pertain to the evaluation of patients at risk for COVID-19 are in a state of rapid change based on information released by regulatory bodies including the CDC and federal and state organizations. These policies and algorithms were followed during the patient's care in the ED.  Final Clinical Impression(s) / ED Diagnoses Final diagnoses:  COVID-19 virus infection  Acute nonintractable headache, unspecified headache type    Rx / DC Orders ED Discharge Orders     None        Sandi Towe, Wenda Overland, MD 12/01/20 0008

## 2021-01-27 ENCOUNTER — Ambulatory Visit: Payer: Managed Care, Other (non HMO) | Admitting: Family Medicine

## 2021-02-24 ENCOUNTER — Ambulatory Visit (INDEPENDENT_AMBULATORY_CARE_PROVIDER_SITE_OTHER): Payer: Managed Care, Other (non HMO) | Admitting: Family Medicine

## 2021-02-24 ENCOUNTER — Other Ambulatory Visit: Payer: Self-pay

## 2021-02-24 ENCOUNTER — Encounter: Payer: Self-pay | Admitting: Family Medicine

## 2021-02-24 VITALS — BP 160/93 | HR 68 | Temp 97.9°F | Ht 75.0 in | Wt 181.0 lb

## 2021-02-24 DIAGNOSIS — Z114 Encounter for screening for human immunodeficiency virus [HIV]: Secondary | ICD-10-CM

## 2021-02-24 DIAGNOSIS — Z13220 Encounter for screening for lipoid disorders: Secondary | ICD-10-CM

## 2021-02-24 DIAGNOSIS — R7309 Other abnormal glucose: Secondary | ICD-10-CM

## 2021-02-24 DIAGNOSIS — R030 Elevated blood-pressure reading, without diagnosis of hypertension: Secondary | ICD-10-CM

## 2021-02-24 DIAGNOSIS — Z1329 Encounter for screening for other suspected endocrine disorder: Secondary | ICD-10-CM | POA: Diagnosis not present

## 2021-02-24 DIAGNOSIS — Z1159 Encounter for screening for other viral diseases: Secondary | ICD-10-CM

## 2021-02-24 DIAGNOSIS — Z0001 Encounter for general adult medical examination with abnormal findings: Secondary | ICD-10-CM | POA: Diagnosis not present

## 2021-02-24 DIAGNOSIS — Z23 Encounter for immunization: Secondary | ICD-10-CM | POA: Diagnosis not present

## 2021-02-24 DIAGNOSIS — Z0000 Encounter for general adult medical examination without abnormal findings: Secondary | ICD-10-CM

## 2021-02-24 DIAGNOSIS — Z13228 Encounter for screening for other metabolic disorders: Secondary | ICD-10-CM

## 2021-02-24 DIAGNOSIS — Z7689 Persons encountering health services in other specified circumstances: Secondary | ICD-10-CM

## 2021-02-24 DIAGNOSIS — Z130 Encounter for screening for diseases of the blood and blood-forming organs and certain disorders involving the immune mechanism: Secondary | ICD-10-CM

## 2021-02-24 NOTE — Progress Notes (Signed)
Charles [name] is a 55 y.o. male presents to office today for annual physical exam examination.    Concerns today include: 1. Low blood sugar Reports blood sugars in the sixties sometimes. He will feel jittery when this happens. Sometimes he doesn't eat breakfast. He does have a family history of diabetes and is worried about this.   He checks his BP frequently at home. It is always 120s/70s. He gets very nervous when coming to the doctor and his bp is usually elevated when he does.   Occupation: Public house manager distribution, Marital status: significant other, Substance use: denies Diet: regular, Exercise: active at work, heavy lifting and walking Last colonoscopy: 03/15/12- repeat in 10 years Immunizations needed: Flu Vaccine: declines  Tdap Vaccine: yes    Past Medical History:  Diagnosis Date   Abdominal pain    Arthritis    Constipation    GERD (gastroesophageal reflux disease)    Headache(784.0)    Social History   Socioeconomic History   Marital status: Significant Other    Spouse name: Not on file   Number of children: 4   Years of education: 12   Highest education level: High school graduate  Occupational History   Occupation: [employer]     Employer: [employer]  Tobacco Use   Smoking status: Never   Smokeless tobacco: Never  Vaping Use   Vaping Use: Never used  Substance and Sexual Activity   Alcohol use: Not Currently    Comment: vodka, twice per week, 1 cup in two mixed drinks. (as of 11/11 no etoh in about a month)   Drug use: No   Sexual activity: Yes    Birth control/protection: None  Other Topics Concern   Not on file  Social History Narrative   Not on file   Social Determinants of Health   Financial Resource Strain: Not on file  Food Insecurity: Not on file  Transportation Needs: Not on file  Physical Activity: Not on file  Stress: Not on file  Social Connections: Not on file  Intimate Partner Violence:  Not on file   Past Surgical History:  Procedure Laterality Date   COLONOSCOPY  03/05/2012   [facility]:[id number] internal hemorrhoids/Two sessile polyps HYPERPLASTIC   ESOPHAGOGASTRODUODENOSCOPY  03/05/2012   [facility]:Non-erosive gastritis (inflammation), negative H.pylori, s/p empiric Savary dilation   ESOPHAGOGASTRODUODENOSCOPY N/A 06/26/2014   Procedure: ESOPHAGOGASTRODUODENOSCOPY (EGD);  Surgeon: Danie Binder, MD;  [facility];  Service: Endoscopy;  Laterality: N/A;  200 - moved to 12:00 - Ginger notified pt   INGUINAL HERNIA REPAIR Right 05/09/2013   Procedure: HERNIA REPAIR INGUINAL ADULT;  Surgeon: Jamesetta So, MD;  Location: [facility];  Service: General;  Laterality: Right;   Family History  Problem Relation Age of Onset   Hypertension Mother    Diabetes Mother    COPD Mother    Arthritis Mother    Stroke Father    Hypertension Father    Hyperlipidemia Father    Asthma Daughter    Colon cancer Neg Hx    Inflammatory bowel disease Neg Hx    Liver disease Neg Hx    No current outpatient medications on file.  No Known Allergies   ROS: Review of Systems Pertinent items noted in HPI and remainder of comprehensive ROS otherwise negative.    Physical exam BP (!) 160/93   Pulse 68   Temp 97.9 F (36.6 C) (Temporal)   Ht 6' 3"  (1.905 m)  Wt 181 lb (82.1 kg)   BMI 22.62 kg/m  General appearance: alert, cooperative, and no distress Head: Normocephalic, without obvious abnormality, atraumatic Eyes: conjunctivae/corneas clear. PERRL, EOM's intact. Fundi benign. Ears: normal TM's and external ear canals both ears Nose: Nares normal. Septum midline. Mucosa normal. No drainage or sinus tenderness. Throat: lips, mucosa, and tongue normal; teeth and gums normal Neck: no adenopathy, no carotid bruit, no JVD, supple, symmetrical, trachea midline, and thyroid not enlarged, symmetric, no tenderness/mass/nodules Lungs: clear to auscultation bilaterally Heart: regular rate and  rhythm, S1, S2 normal, no murmur, click, rub or gallop Abdomen: soft, non-tender; bowel sounds normal; no masses,  no organomegaly Extremities: extremities normal, atraumatic, no cyanosis or edema Skin: Skin color, texture, turgor normal. No rashes or lesions Lymph nodes: Cervical, supraclavicular, and axillary nodes normal. Neurologic: Alert and oriented X 3, normal strength and tone. Normal symmetric reflexes. Normal coordination and gait    Assessment/ Plan: Charles [name] here for annual physical exam.   Charles [name] was seen today for new patient (initial visit) and annual exam.  Diagnoses and all orders for this visit:  Routine general medical examination at a health care facility Will return for fasting labs. -     CBC with Differential/Platelet; Future -     CMP14+EGFR; Future -     Lipid panel; Future -     Thyroid Panel With TSH; Future  Elevated BP without diagnosis of hypertension Well controlled at home. Labs pending.  -     CBC with Differential/Platelet; Future -     CMP14+EGFR; Future -     Lipid panel; Future  Screening for lipoid disorders -     Lipid panel; Future  Screening for endocrine, metabolic and immunity disorder -     CBC with Differential/Platelet; Future -     Thyroid Panel With TSH; Future  Encounter for screening for HIV -     HIV antibody (with reflex); Future  Need for hepatitis C screening test -     Hepatitis C antibody; Future  Need for vaccination        -     Tdap vaccine greater than or equal to 7yo IM   Abnormal glucose Discussed regular food intake and hydration.  -     Hemoglobin A1c; Future  Counseled on healthy lifestyle choices, including diet (rich in fruits, vegetables and lean meats and low in salt and simple carbohydrates) and exercise (at least 30 minutes of moderate physical activity daily).  Patient to follow up in 1 year for annual exam or sooner if needed.  The above assessment and management plan was discussed with  the patient. The patient verbalized understanding of and has agreed to the management plan. Patient is aware to call the clinic if symptoms persist or worsen. Patient is aware when to return to the clinic for a follow-up visit. Patient educated on when it is appropriate to go to the emergency department.   Charles Smolder, FNP-C [facility] [address] [place], [place] [address] [phone]

## 2021-02-24 NOTE — Patient Instructions (Signed)
Health Maintenance, Male Adopting a healthy lifestyle and getting preventive care are important in promoting health and wellness. Ask your health care provider about: The right schedule for you to have regular tests and exams. Things you can do on your own to prevent diseases and keep yourself healthy. What should I know about diet, weight, and exercise? Eat a healthy diet  Eat a diet that includes plenty of vegetables, fruits, low-fat dairy products, and lean protein. Do not eat a lot of foods that are high in solid fats, added sugars, or sodium. Maintain a healthy weight Body mass index (BMI) is a measurement that can be used to identify possible weight problems. It estimates body fat based on height and weight. Your health care provider can help determine your BMI and help you achieve or maintain a healthy weight. Get regular exercise Get regular exercise. This is one of the most important things you can do for your health. Most adults should: Exercise for at least 150 minutes each week. The exercise should increase your heart rate and make you sweat (moderate-intensity exercise). Do strengthening exercises at least twice a week. This is in addition to the moderate-intensity exercise. Spend less time sitting. Even light physical activity can be beneficial. Watch cholesterol and blood lipids Have your blood tested for lipids and cholesterol at 53 years of age, then have this test every 5 years. You may need to have your cholesterol levels checked more often if: Your lipid or cholesterol levels are high. You are older than 53 years of age. You are at high risk for heart disease. What should I know about cancer screening? Many types of cancers can be detected early and may often be prevented. Depending on your health history and family history, you may need to have cancer screening at various ages. This may include screening for: Colorectal cancer. Prostate cancer. Skin cancer. Lung  cancer. What should I know about heart disease, diabetes, and high blood pressure? Blood pressure and heart disease High blood pressure causes heart disease and increases the risk of stroke. This is more likely to develop in people who have high blood pressure readings, are of African descent, or are overweight. Talk with your health care provider about your target blood pressure readings. Have your blood pressure checked: Every 3-5 years if you are 18-39 years of age. Every year if you are 40 years old or older. If you are between the ages of 65 and 75 and are a current or former smoker, ask your health care provider if you should have a one-time screening for abdominal aortic aneurysm (AAA). Diabetes Have regular diabetes screenings. This checks your fasting blood sugar level. Have the screening done: Once every three years after age 45 if you are at a normal weight and have a low risk for diabetes. More often and at a younger age if you are overweight or have a high risk for diabetes. What should I know about preventing infection? Hepatitis B If you have a higher risk for hepatitis B, you should be screened for this virus. Talk with your health care provider to find out if you are at risk for hepatitis B infection. Hepatitis C Blood testing is recommended for: Everyone born from 1945 through 1965. Anyone with known risk factors for hepatitis C. Sexually transmitted infections (STIs) You should be screened each year for STIs, including gonorrhea and chlamydia, if: You are sexually active and are younger than 53 years of age. You are older than 53 years   of age and your health care provider tells you that you are at risk for this type of infection. Your sexual activity has changed since you were last screened, and you are at increased risk for chlamydia or gonorrhea. Ask your health care provider if you are at risk. Ask your health care provider about whether you are at high risk for HIV.  Your health care provider may recommend a prescription medicine to help prevent HIV infection. If you choose to take medicine to prevent HIV, you should first get tested for HIV. You should then be tested every 3 months for as long as you are taking the medicine. Follow these instructions at home: Lifestyle Do not use any products that contain nicotine or tobacco, such as cigarettes, e-cigarettes, and chewing tobacco. If you need help quitting, ask your health care provider. Do not use street drugs. Do not share needles. Ask your health care provider for help if you need support or information about quitting drugs. Alcohol use Do not drink alcohol if your health care provider tells you not to drink. If you drink alcohol: Limit how much you have to 0-2 drinks a day. Be aware of how much alcohol is in your drink. In the [place] one drink equals one 12 oz bottle of beer (355 mL), one 5 oz glass of wine (148 mL), or one 1 oz glass of hard liquor (44 mL). General instructions Schedule regular health, dental, and eye exams. Stay current with your vaccines. Tell your health care provider if: You often feel depressed. You have ever been abused or do not feel safe at home. Summary Adopting a healthy lifestyle and getting preventive care are important in promoting health and wellness. Follow your health care provider's instructions about healthy diet, exercising, and getting tested or screened for diseases. Follow your health care provider's instructions on monitoring your cholesterol and blood pressure. This information is not intended to replace advice given to you by your health care provider. Make sure you discuss any questions you have with your health care provider. Document Revised: 07/23/2020 Document Reviewed: 05/08/2018 Elsevier Patient Education  2022 Elsevier Inc.  

## 2021-03-15 ENCOUNTER — Other Ambulatory Visit: Payer: Managed Care, Other (non HMO)

## 2021-03-15 ENCOUNTER — Other Ambulatory Visit: Payer: Self-pay

## 2021-03-15 DIAGNOSIS — Z1159 Encounter for screening for other viral diseases: Secondary | ICD-10-CM

## 2021-03-15 DIAGNOSIS — Z1329 Encounter for screening for other suspected endocrine disorder: Secondary | ICD-10-CM

## 2021-03-15 DIAGNOSIS — R7309 Other abnormal glucose: Secondary | ICD-10-CM

## 2021-03-15 DIAGNOSIS — Z130 Encounter for screening for diseases of the blood and blood-forming organs and certain disorders involving the immune mechanism: Secondary | ICD-10-CM

## 2021-03-15 DIAGNOSIS — R030 Elevated blood-pressure reading, without diagnosis of hypertension: Secondary | ICD-10-CM

## 2021-03-15 DIAGNOSIS — Z114 Encounter for screening for human immunodeficiency virus [HIV]: Secondary | ICD-10-CM

## 2021-03-15 DIAGNOSIS — Z0000 Encounter for general adult medical examination without abnormal findings: Secondary | ICD-10-CM

## 2021-03-15 DIAGNOSIS — Z13220 Encounter for screening for lipoid disorders: Secondary | ICD-10-CM

## 2021-03-15 LAB — HEMOGLOBIN A1C: Hgb A1c MFr Bld: 4.4 % — ABNORMAL LOW (ref 4.8–5.6)

## 2021-03-16 LAB — LIPID PANEL
Chol/HDL Ratio: 6.1 ratio — ABNORMAL HIGH (ref 0.0–5.0)
Cholesterol, Total: 221 mg/dL — ABNORMAL HIGH (ref 100–199)
HDL: 36 mg/dL — ABNORMAL LOW (ref >39)
LDL Chol Calc (NIH): 165 mg/dL — ABNORMAL HIGH (ref 0–99)
Triglycerides: 112 mg/dL (ref 0–149)
VLDL Cholesterol Cal: 20 mg/dL (ref 5–40)

## 2021-03-16 LAB — THYROID PANEL WITH TSH
Free Thyroxine Index: 2.5 (ref 1.2–4.9)
T3 Uptake Ratio: 29 % (ref 24–39)
T4, Total: 8.5 ug/dL (ref 4.5–12.0)
TSH: 0.525 u[IU]/mL (ref 0.450–4.500)

## 2021-03-16 LAB — CMP14+EGFR
ALT: 19 IU/L (ref 0–44)
AST: 24 IU/L (ref 0–40)
Albumin/Globulin Ratio: 1.7 (ref 1.2–2.2)
Albumin: 4.5 g/dL (ref 3.8–4.9)
Alkaline Phosphatase: 79 IU/L (ref 44–121)
BUN/Creatinine Ratio: 10 (ref 9–20)
BUN: 9 mg/dL (ref 6–24)
Bilirubin Total: 0.5 mg/dL (ref 0.0–1.2)
CO2: 24 mmol/L (ref 20–29)
Calcium: 8.8 mg/dL (ref 8.7–10.2)
Chloride: 101 mmol/L (ref 96–106)
Creatinine, Ser: 0.92 mg/dL (ref 0.76–1.27)
Globulin, Total: 2.6 g/dL (ref 1.5–4.5)
Glucose: 89 mg/dL (ref 70–99)
Potassium: 4.1 mmol/L (ref 3.5–5.2)
Sodium: 139 mmol/L (ref 134–144)
Total Protein: 7.1 g/dL (ref 6.0–8.5)
eGFR: 99 mL/min/{1.73_m2} (ref >59)

## 2021-03-16 LAB — CBC WITH DIFFERENTIAL/PLATELET
Basophils Absolute: 0 10*3/uL (ref 0.0–0.2)
Basos: 1 %
EOS (ABSOLUTE): 0.1 10*3/uL (ref 0.0–0.4)
Eos: 3 %
Hematocrit: 42.6 % (ref 37.5–51.0)
Hemoglobin: 14 g/dL (ref 13.0–17.7)
Immature Grans (Abs): 0 10*3/uL (ref 0.0–0.1)
Immature Granulocytes: 0 %
Lymphocytes Absolute: 0.9 10*3/uL (ref 0.7–3.1)
Lymphs: 21 %
MCH: 29 pg (ref 26.6–33.0)
MCHC: 32.9 g/dL (ref 31.5–35.7)
MCV: 88 fL (ref 79–97)
Monocytes Absolute: 0.4 10*3/uL (ref 0.1–0.9)
Monocytes: 10 %
Neutrophils Absolute: 2.9 10*3/uL (ref 1.4–7.0)
Neutrophils: 65 %
Platelets: 222 10*3/uL (ref 150–450)
RBC: 4.82 x10E6/uL (ref 4.14–5.80)
RDW: 12.7 % (ref 11.6–15.4)
WBC: 4.3 10*3/uL (ref 3.4–10.8)

## 2021-03-16 LAB — HIV ANTIBODY (ROUTINE TESTING W REFLEX): HIV Screen 4th Generation wRfx: NONREACTIVE

## 2021-03-16 LAB — HEPATITIS C ANTIBODY: Hep C Virus Ab: <0.1 s/co ratio (ref 0.0–0.9)

## 2021-09-21 ENCOUNTER — Ambulatory Visit (INDEPENDENT_AMBULATORY_CARE_PROVIDER_SITE_OTHER): Payer: Managed Care, Other (non HMO) | Admitting: Family Medicine

## 2021-09-21 ENCOUNTER — Encounter: Payer: Self-pay | Admitting: Family Medicine

## 2021-09-21 VITALS — BP 129/89 | HR 78 | Temp 97.6°F | Ht 75.0 in | Wt 183.5 lb

## 2021-09-21 DIAGNOSIS — K2980 Duodenitis without bleeding: Secondary | ICD-10-CM

## 2021-09-21 DIAGNOSIS — R110 Nausea: Secondary | ICD-10-CM | POA: Diagnosis not present

## 2021-09-21 DIAGNOSIS — D7218 Eosinophilia in diseases classified elsewhere: Secondary | ICD-10-CM | POA: Diagnosis not present

## 2021-09-21 DIAGNOSIS — R1013 Epigastric pain: Secondary | ICD-10-CM

## 2021-09-21 MED ORDER — ONDANSETRON HCL 4 MG PO TABS: 4.0000 mg | ORAL_TABLET | Freq: Three times a day (TID) | ORAL | 0 refills | Status: DC | PRN

## 2021-09-21 MED ORDER — OMEPRAZOLE 20 MG PO CPDR: 20.0000 mg | DELAYED_RELEASE_CAPSULE | Freq: Every day | ORAL | 3 refills | Status: DC

## 2021-09-21 NOTE — Progress Notes (Signed)
? ?Acute Office Visit ? ?Subjective:  ? ?  ?Patient ID: Charles [name], male    DOB: 30-Mar-1966, 55 y.o.   MRN: [id number] ? ?Chief Complaint  ?Patient presents with  ? Abdominal Pain  ? ? ?HPI ?Here with significant other. Patient is in today for abdominal pain. This is chronic but has been worse for last 6 months. It is now a constant ache in the epigastric region. Pain starts shortly after waking. He sometimes has heartburn. Reports intermittent nausea most days as well. Denies vomiting. He reports a BM 3x a week. Reports sometimes feels constipated. Also reports intermittent bloating. Denies rectal bleeding or blood in stool. Denies weight loss or vomiting. Denies trouble swallowing. Denies diarrhea, fever, or chills. He has a history of duodenitis and gastritis previously has been follow up GI but last appt was in 2016. ? ? ?  09/21/2021  ?  3:19 PM  ?GAD 7 : Generalized Anxiety Score  ?Nervous, Anxious, on Edge 0  ?Control/stop worrying 0  ?Worry too much - different things 0  ?Trouble relaxing 0  ?Restless 0  ?Easily annoyed or irritable 0  ?Afraid - awful might happen 0  ?Total GAD 7 Score 0  ?Anxiety Difficulty Not difficult at all  ? ? ? ?  09/21/2021  ?  3:19 PM  ?GAD 7 : Generalized Anxiety Score  ?Nervous, Anxious, on Edge 0  ?Control/stop worrying 0  ?Worry too much - different things 0  ?Trouble relaxing 0  ?Restless 0  ?Easily annoyed or irritable 0  ?Afraid - awful might happen 0  ?Total GAD 7 Score 0  ?Anxiety Difficulty Not difficult at all  ? ? ? ?ROS ?As per HPI. ? ?   ?Objective:  ?  ?BP 129/89 Comment: at home reading per pt  Pulse 78   Temp 97.6 ?F (36.4 ?C) (Temporal)   Ht '6\' 3"'$  (1.905 m)   Wt 183 lb 8 oz (83.2 kg)   BMI 22.94 kg/m?  ? ?  ? ?Physical Exam ?Vitals and nursing note reviewed.  ?Constitutional:   ?   General: He is not in acute distress. ?   Appearance: He is not ill-appearing, toxic-appearing or diaphoretic.  ?Cardiovascular:  ?   Rate and Rhythm: Normal rate and regular  rhythm.  ?   Heart sounds: Normal heart sounds. No murmur heard. ?Pulmonary:  ?   Effort: Pulmonary effort is normal. No respiratory distress.  ?   Breath sounds: Normal breath sounds.  ?Abdominal:  ?   General: Abdomen is flat. Bowel sounds are normal. There is no distension.  ?   Palpations: Abdomen is soft.  ?   Tenderness: There is abdominal tenderness in the epigastric area. There is no guarding or rebound. Negative signs include Murphy's sign, Rovsing's sign and McBurney's sign.  ?Neurological:  ?   General: No focal deficit present.  ?   Mental Status: He is alert and oriented to person, place, and time.  ?Psychiatric:     ?   Mood and Affect: Mood normal.     ?   Behavior: Behavior normal.  ? ? ?No results found for any visits on 09/21/21. ? ? ?   ?Assessment & Plan:  ? ?Renee was seen today for abdominal pain. ? ?Diagnoses and all orders for this visit: ? ?Eosinophilic duodenitis ?Epigastric pain ?Nausea ?Discussed chronic diagnosis. Uncontrolled. Will start prilosec today. Discussed can have pepcid BID OTC as needed. Will placed new referral to GI for further evaluation.  Zofran prn for nausea. Strict return precautions given.  ?-     omeprazole (PRILOSEC) 20 MG capsule; Take 1 capsule (20 mg total) by mouth daily. ?-     Ambulatory referral to Gastroenterology ?-     ondansetron (ZOFRAN) 4 MG tablet; Take 1 tablet (4 mg total) by mouth every 8 (eight) hours as needed for nausea or vomiting. ? ?Return in about 4 weeks (around 10/19/2021) for medication follow up. ? ?The patient indicates understanding of these issues and agrees with the plan. ? ?Gwenlyn Perking, FNP ? ? ?

## 2021-09-21 NOTE — Patient Instructions (Signed)
Food Choices for Gastroesophageal Reflux Disease, Adult When you have gastroesophageal reflux disease (GERD), the foods you eat and your eating habits are very important. Choosing the right foods can help ease the discomfort of GERD. Consider working with a dietitian to help you make healthy food choices. What are tips for following this plan? Reading food labels Look for foods that are low in saturated fat. Foods that have less than 5% of daily value (DV) of fat and 0 g of trans fats may help with your symptoms. Cooking Cook foods using methods other than frying. This may include baking, steaming, grilling, or broiling. These are all methods that do not need a lot of fat for cooking. To add flavor, try to use herbs that are low in spice and acidity. Meal planning  Choose healthy foods that are low in fat, such as fruits, vegetables, whole grains, low-fat dairy products, lean meats, fish, and poultry. Eat frequent, small meals instead of three large meals each day. Eat your meals slowly, in a relaxed setting. Avoid bending over or lying down until 2-3 hours after eating. Limit high-fat foods such as fatty meats or fried foods. Limit your intake of fatty foods, such as oils, butter, and shortening. Avoid the following as told by your health care provider: Foods that cause symptoms. These may be different for different people. Keep a food diary to keep track of foods that cause symptoms. Alcohol. Drinking large amounts of liquid with meals. Eating meals during the 2-3 hours before bed. Lifestyle Maintain a healthy weight. Ask your health care provider what weight is healthy for you. If you need to lose weight, work with your health care provider to do so safely. Exercise for at least 30 minutes on 5 or more days each week, or as told by your health care provider. Avoid wearing clothes that fit tightly around your waist and chest. Do not use any products that contain nicotine or tobacco. These  products include cigarettes, chewing tobacco, and vaping devices, such as e-cigarettes. If you need help quitting, ask your health care provider. Sleep with the head of your bed raised. Use a wedge under the mattress or blocks under the bed frame to raise the head of the bed. Chew sugar-free gum after mealtimes. What foods should I eat?  Eat a healthy, well-balanced diet of fruits, vegetables, whole grains, low-fat dairy products, lean meats, fish, and poultry. Each person is different. Foods that may trigger symptoms in one person may not trigger any symptoms in another person. Work with your health care provider to identify foods that are safe for you. The items listed above may not be a complete list of recommended foods and beverages. Contact a dietitian for more information. What foods should I avoid? Limiting some of these foods may help manage the symptoms of GERD. Everyone is different. Consult a dietitian or your health care provider to help you identify the exact foods to avoid, if any. Fruits Any fruits prepared with added fat. Any fruits that cause symptoms. For some people this may include citrus fruits, such as oranges, grapefruit, pineapple, and lemons. Vegetables Deep-fried vegetables. French fries. Any vegetables prepared with added fat. Any vegetables that cause symptoms. For some people, this may include tomatoes and tomato products, chili peppers, onions and garlic, and horseradish. Grains Pastries or quick breads with added fat. Meats and other proteins High-fat meats, such as fatty beef or pork, hot dogs, ribs, ham, sausage, salami, and bacon. Fried meat or protein, including   fried fish and fried chicken. Nuts and nut butters, in large amounts. Dairy Whole milk and chocolate milk. Sour cream. Cream. Ice cream. Cream cheese. Milkshakes. Fats and oils Butter. Margarine. Shortening. Ghee. Beverages Coffee and tea, with or without caffeine. Carbonated beverages. Sodas. Energy  drinks. Fruit juice made with acidic fruits, such as orange or grapefruit. Tomato juice. Alcoholic drinks. Sweets and desserts Chocolate and cocoa. Donuts. Seasonings and condiments Pepper. Peppermint and spearmint. Added salt. Any condiments, herbs, or seasonings that cause symptoms. For some people, this may include curry, hot sauce, or vinegar-based salad dressings. The items listed above may not be a complete list of foods and beverages to avoid. Contact a dietitian for more information. Questions to ask your health care provider Diet and lifestyle changes are usually the first steps that are taken to manage symptoms of GERD. If diet and lifestyle changes do not improve your symptoms, talk with your health care provider about taking medicines. Where to find more information International Foundation for Gastrointestinal Disorders: aboutgerd.org Summary When you have gastroesophageal reflux disease (GERD), food and lifestyle choices may be very helpful in easing the discomfort of GERD. Eat frequent, small meals instead of three large meals each day. Eat your meals slowly, in a relaxed setting. Avoid bending over or lying down until 2-3 hours after eating. Limit high-fat foods such as fatty meats or fried foods. This information is not intended to replace advice given to you by your health care provider. Make sure you discuss any questions you have with your health care provider. Document Revised: 11/24/2019 Document Reviewed: 11/24/2019 Elsevier Patient Education  2023 Elsevier Inc.  

## 2021-09-27 ENCOUNTER — Encounter: Payer: Self-pay | Admitting: Internal Medicine

## 2021-10-19 ENCOUNTER — Ambulatory Visit: Payer: Managed Care, Other (non HMO) | Admitting: Family Medicine

## 2021-10-26 NOTE — Progress Notes (Unsigned)
Referring Provider: Gwenlyn Perking, FNP Primary Care Physician:  Gwenlyn Perking, FNP Primary Gastroenterologist:  Dr. Abbey Chatters  No chief complaint on file.   HPI:   Charles [name] is a 56 y.o. male presenting today at the request of Gwenlyn Perking, FNP  We have not seen patient since 2016.  He was having trouble with nausea and epigastric abdominal pain.  Dexilant had not been helpful.  Prior evaluation with normal abdominal ultrasound, HIDA normal.  He was scheduled for gastric emptying study which revealed normal exam.  He later underwent EGD on 06/26/2014 revealing mild nonerosive gastritis s/p biopsy, normal examined duodenum s/p biopsied.  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 pylori.  He was treated with a course of prednisone.  Patient saw PCP 09/21/2021 reporting chronic abdominal pain that has been worsening for the last 6 months, described as a constant ache in the epigastric area.  Typically started shortly after waking.  Also with intermittent nausea most days, some heartburn, and some constipation with bowel movements 3 times a week.  No weight loss or vomiting.  He was started on omeprazole 20 mg daily, advised he can take Pepcid twice daily over-the-counter, Zofran for nausea, and advised to follow-up with GI for further evaluation.  Today:    Past Medical History:  Diagnosis Date   Abdominal pain    Arthritis    Constipation    GERD (gastroesophageal reflux disease)    Headache(784.0)     Past Surgical History:  Procedure Laterality Date   COLONOSCOPY  03/05/2012   JIR:CVELF internal hemorrhoids/Two sessile polyps HYPERPLASTIC   ESOPHAGOGASTRODUODENOSCOPY  03/05/2012   SLF:Non-erosive gastritis (inflammation), negative H.pylori, s/p empiric Savary dilation   ESOPHAGOGASTRODUODENOSCOPY N/A 06/26/2014   Procedure: ESOPHAGOGASTRODUODENOSCOPY (EGD);  Surgeon:  Danie Binder, MD;  Location: [facility];  Service: Endoscopy;  Laterality: N/A;  200 - moved to 12:00 - Ginger notified pt   INGUINAL HERNIA REPAIR Right 05/09/2013   Procedure: HERNIA REPAIR INGUINAL ADULT;  Surgeon: Jamesetta So, MD;  Location: [facility];  Service: General;  Laterality: Right;    Current Outpatient Medications  Medication Sig Dispense Refill   omeprazole (PRILOSEC) 20 MG capsule Take 1 capsule (20 mg total) by mouth daily. 30 capsule 3   ondansetron (ZOFRAN) 4 MG tablet Take 1 tablet (4 mg total) by mouth every 8 (eight) hours as needed for nausea or vomiting. 20 tablet 0   No current facility-administered medications for this visit.    Allergies as of 10/27/2021   (No Known Allergies)    Family History  Problem Relation Age of Onset   Hypertension Mother    Diabetes Mother    COPD Mother    Arthritis Mother    Stroke Father    Hypertension Father    Hyperlipidemia Father    Asthma Daughter    Colon cancer Neg Hx    Inflammatory bowel disease Neg Hx    Liver disease Neg Hx     Social History   Socioeconomic History   Marital status: Significant Other    Spouse name: Not on file   Number of children: 4   Years of education: 12   Highest education level: High school graduate  Occupational History   Occupation: [place]     Employer: [employer]  Tobacco Use   Smoking status: Never   Smokeless tobacco: Never  Vaping Use  Vaping Use: Never used  Substance and Sexual Activity   Alcohol use: Not Currently    Comment: vodka, twice per week, 1 cup in two mixed drinks. (as of 11/11 no etoh in about a month)   Drug use: No   Sexual activity: Yes    Birth control/protection: None  Other Topics Concern   Not on file  Social History Narrative   Not on file   Social Determinants of Health   Financial Resource Strain: Not on file  Food Insecurity: Not on file  Transportation Needs: Not on file  Physical  Activity: Not on file  Stress: Not on file  Social Connections: Not on file  Intimate Partner Violence: Not on file    Review of Systems: Gen: Denies any fever, chills, cold or flulike symptoms, presyncope, syncope. CV: Denies chest pain, heart palpitations. Resp: Denies shortness of breath, cough. GI: See HPI GU : Denies urinary burning, urinary frequency, urinary hesitancy MS: Denies joint pain. Derm: Denies rash. Psych: Denies depression, anxiety. Heme: See HPI  Physical Exam: There were no vitals taken for this visit. General:   Alert and oriented. Pleasant and cooperative. Well-nourished and well-developed.  Head:  Normocephalic and atraumatic. Eyes:  Without icterus, sclera clear and conjunctiva pink.  Ears:  Normal auditory acuity. Lungs:  Clear to auscultation bilaterally. No wheezes, rales, or rhonchi. No distress.  Heart:  S1, S2 present without murmurs appreciated.  Abdomen:  +BS, soft, non-tender and non-distended. No HSM noted. No guarding or rebound. No masses appreciated.  Rectal:  Deferred  Msk:  Symmetrical without gross deformities. Normal posture. Extremities:  Without edema. Neurologic:  Alert and  oriented x4;  grossly normal neurologically. Skin:  Intact without significant lesions or rashes. Psych:  Normal mood and affect.    Assessment:     Plan:  ***   Aliene Altes, PA-C [facility] 10/27/2021

## 2021-10-27 ENCOUNTER — Telehealth: Payer: Self-pay

## 2021-10-27 ENCOUNTER — Encounter: Payer: Self-pay | Admitting: Gastroenterology

## 2021-10-27 ENCOUNTER — Ambulatory Visit (INDEPENDENT_AMBULATORY_CARE_PROVIDER_SITE_OTHER): Payer: Managed Care, Other (non HMO) | Admitting: Gastroenterology

## 2021-10-27 VITALS — BP 146/82 | HR 78 | Temp 97.6°F | Ht 75.0 in | Wt 184.6 lb

## 2021-10-27 DIAGNOSIS — R1013 Epigastric pain: Secondary | ICD-10-CM

## 2021-10-27 DIAGNOSIS — R12 Heartburn: Secondary | ICD-10-CM | POA: Diagnosis not present

## 2021-10-27 DIAGNOSIS — R110 Nausea: Secondary | ICD-10-CM

## 2021-10-27 DIAGNOSIS — R198 Other specified symptoms and signs involving the digestive system and abdomen: Secondary | ICD-10-CM | POA: Diagnosis not present

## 2021-10-27 DIAGNOSIS — Z1211 Encounter for screening for malignant neoplasm of colon: Secondary | ICD-10-CM

## 2021-10-27 MED ORDER — OMEPRAZOLE 40 MG PO CPDR: 40.0000 mg | DELAYED_RELEASE_CAPSULE | Freq: Every day | ORAL | 3 refills | Status: DC

## 2021-10-27 NOTE — Telephone Encounter (Signed)
PA for EGD submitted via [facility]. Case went to clinical review. Case# [id number].

## 2021-10-27 NOTE — Patient Instructions (Signed)
We will arrange for you to have an upper endoscopy in the near future with Dr. Abbey Chatters.  Increase omeprazole to 40 mg daily 30 minutes before breakfast.  I have sent in a prescription to your pharmacy.  Avoid all NSAID products for now including ibuprofen, Aleve, Advil, naproxen, BC powders, Goody powders, and anything that says "NSAID" on the package.  You may use Tylenol as needed for pain.  No more than 3000 mg in a 24-hour period.   Avoid fried, fatty, greasy, spicy foods.  Limit carbonated beverages.  Try eating 4-6 small meals daily.  The largest meal of the day should not be in the evening.  Try not to eat within 3 hours of going to bed.  Sleep with the head of your bed propped up 4-6 inches.   For alternating bowel habits: Add Benefiber 2 teaspoons daily x2 weeks then increase to twice daily.  We will plan to see you back in the office after your procedure.  Do not hesitate to call if you have any questions or concerns prior to your next visit.  It was very nice to meet you today!  Aliene Altes, PA-C [facility]

## 2021-10-31 ENCOUNTER — Other Ambulatory Visit: Payer: Self-pay

## 2021-10-31 NOTE — Telephone Encounter (Signed)
EGD approved. PA# [id number], valid 10/27/21-04/25/22.

## 2021-12-06 ENCOUNTER — Ambulatory Visit (HOSPITAL_COMMUNITY)
Admission: RE | Admit: 2021-12-06 | Discharge: 2021-12-06 | Disposition: A | Discharge status: Home / Self Care | Payer: Managed Care, Other (non HMO) | Source: Ambulatory Visit | Attending: Internal Medicine | Admitting: Internal Medicine

## 2021-12-06 ENCOUNTER — Encounter (HOSPITAL_COMMUNITY): Payer: Self-pay

## 2021-12-06 ENCOUNTER — Other Ambulatory Visit: Payer: Self-pay

## 2021-12-06 ENCOUNTER — Ambulatory Visit (HOSPITAL_COMMUNITY): Payer: Managed Care, Other (non HMO) | Admitting: Anesthesiology

## 2021-12-06 ENCOUNTER — Encounter (HOSPITAL_COMMUNITY)
Admission: RE | Disposition: A | Discharge status: Home / Self Care | Payer: Self-pay | Source: Ambulatory Visit | Attending: Internal Medicine

## 2021-12-06 DIAGNOSIS — K2289 Other specified disease of esophagus: Secondary | ICD-10-CM | POA: Insufficient documentation

## 2021-12-06 DIAGNOSIS — K219 Gastro-esophageal reflux disease without esophagitis: Secondary | ICD-10-CM | POA: Diagnosis not present

## 2021-12-06 DIAGNOSIS — K2970 Gastritis, unspecified, without bleeding: Secondary | ICD-10-CM | POA: Diagnosis not present

## 2021-12-06 DIAGNOSIS — R12 Heartburn: Secondary | ICD-10-CM

## 2021-12-06 DIAGNOSIS — R1013 Epigastric pain: Secondary | ICD-10-CM | POA: Diagnosis present

## 2021-12-06 DIAGNOSIS — R110 Nausea: Secondary | ICD-10-CM

## 2021-12-06 DIAGNOSIS — R194 Change in bowel habit: Secondary | ICD-10-CM

## 2021-12-06 HISTORY — PX: ESOPHAGOGASTRODUODENOSCOPY (EGD) WITH PROPOFOL: SHX5813

## 2021-12-06 HISTORY — PX: BIOPSY: SHX5522

## 2021-12-06 SURGERY — ESOPHAGOGASTRODUODENOSCOPY (EGD) WITH PROPOFOL
Anesthesia: General

## 2021-12-06 MED ORDER — LACTATED RINGERS IV SOLN
INTRAVENOUS | Status: DC
  Administered 2021-12-06: 1000 mL via INTRAVENOUS

## 2021-12-06 MED ORDER — LIDOCAINE HCL (CARDIAC) PF 100 MG/5ML IV SOSY
PREFILLED_SYRINGE | INTRAVENOUS | Status: DC | PRN
  Administered 2021-12-06: 50 mg via INTRAVENOUS

## 2021-12-06 MED ORDER — OMEPRAZOLE 40 MG PO CPDR: 40.0000 mg | DELAYED_RELEASE_CAPSULE | Freq: Two times a day (BID) | ORAL | 5 refills | Status: DC

## 2021-12-06 MED ORDER — LACTATED RINGERS IV SOLN: INTRAVENOUS | Status: DC

## 2021-12-06 MED ORDER — PROPOFOL 10 MG/ML IV BOLUS
INTRAVENOUS | Status: DC | PRN
  Administered 2021-12-06: 40 mg via INTRAVENOUS
  Administered 2021-12-06: 120 mg via INTRAVENOUS
  Administered 2021-12-06: 40 mg via INTRAVENOUS

## 2021-12-06 NOTE — OR Nursing (Signed)
Charles [name] had a procedure at [facility] on 12/06/2021. He is unable to drive or operate heavy machinery for 24 hours. He may resume normal activity after 11:30 am on 12/07/2021.

## 2021-12-06 NOTE — H&P (Signed)
Primary Care Physician:  Gwenlyn Perking, FNP Primary Gastroenterologist:  Dr. Abbey Chatters  Pre-Procedure History & Physical: HPI:  Charles [name] is a 56 y.o. male is here for an EGD to be performed for epigastric pain, N/V, heartburn  Past Medical History:  Diagnosis Date   Abdominal pain    Arthritis    Constipation    GERD (gastroesophageal reflux disease)    Headache(784.0)     Past Surgical History:  Procedure Laterality Date   COLONOSCOPY  03/05/2012   NAT:FTDDU internal hemorrhoids/Two sessile polyps HYPERPLASTIC   ESOPHAGOGASTRODUODENOSCOPY  03/05/2012   SLF:Non-erosive gastritis (inflammation), negative H.pylori, s/p empiric Savary dilation   ESOPHAGOGASTRODUODENOSCOPY N/A 06/26/2014   Procedure: ESOPHAGOGASTRODUODENOSCOPY (EGD);  Surgeon: Danie Binder, MD;  Location[facility];  Service: Endoscopy;  Laterality: N/A;  200 - moved to 12:00 - Ginger notified pt   INGUINAL HERNIA REPAIR Right 05/09/2013   Procedure: HERNIA REPAIR INGUINAL ADULT;  Surgeon: Jamesetta So, MD;  Location: [facility];  Service: General;  Laterality: Right;    Prior to Admission medications   Medication Sig Start Date End Date Taking? Authorizing Provider  Multiple Vitamins-Minerals (MULTIVITAMIN WITH MINERALS) tablet Take 1 tablet by mouth daily.   Yes [provider]  omeprazole (ACID REDUCER) 20 MG tablet Take 20 mg by mouth at bedtime. 09/28/21  Yes [provider]  omeprazole (PRILOSEC) 40 MG capsule Take 1 capsule (40 mg total) by mouth daily before breakfast. 10/27/21  Yes Jodi Mourning, Kristen S, PA-C  ondansetron (ZOFRAN) 4 MG tablet Take 1 tablet (4 mg total) by mouth every 8 (eight) hours as needed for nausea or vomiting. 09/21/21  Yes Gwenlyn Perking, FNP    Allergies as of 10/27/2021   (No Known Allergies)    Family History  Problem Relation Age of Onset   Hypertension Mother    Diabetes Mother    COPD Mother    Arthritis Mother    Stroke Father    Hypertension  Father    Hyperlipidemia Father    Asthma Daughter    Colon cancer Neg Hx    Inflammatory bowel disease Neg Hx    Liver disease Neg Hx     Social History   Socioeconomic History   Marital status: Single    Spouse name: Not on file   Number of children: 4   Years of education: 12   Highest education level: High school graduate  Occupational History   Occupation: [place]     Employer: [employer]  Tobacco Use   Smoking status: Never   Smokeless tobacco: Never  Vaping Use   Vaping Use: Never used  Substance and Sexual Activity   Alcohol use: Not Currently    Comment: vodka, twice per week, 1 cup in two mixed drinks. (as of 11/11 no etoh in about a month)   Drug use: No   Sexual activity: Yes    Birth control/protection: None  Other Topics Concern   Not on file  Social History Narrative   Not on file   Social Determinants of Health   Financial Resource Strain: Not on file  Food Insecurity: Not on file  Transportation Needs: Not on file  Physical Activity: Not on file  Stress: Not on file  Social Connections: Not on file  Intimate Partner Violence: Not on file    Review of Systems: General: Negative for fever, chills, fatigue, weakness. Eyes: Negative for vision changes.  ENT: Negative for hoarseness, difficulty  swallowing , nasal congestion. CV: Negative for chest pain, angina, palpitations, dyspnea on exertion, peripheral edema.  Respiratory: Negative for dyspnea at rest, dyspnea on exertion, cough, sputum, wheezing.  GI: See history of present illness. GU:  Negative for dysuria, hematuria, urinary incontinence, urinary frequency, nocturnal urination.  MS: Negative for joint pain, low back pain.  Derm: Negative for rash or itching.  Neuro: Negative for weakness, abnormal sensation, seizure, frequent headaches, memory loss, confusion.  Psych: Negative for anxiety, depression Endo: Negative for unusual weight change.   Heme: Negative for bruising or bleeding. Allergy: Negative for rash or hives.  Physical Exam: Vital signs in last 24 hours: Temp:  [99.1 F (37.3 C)] 99.1 F (37.3 C) (07/11 1008) Pulse Rate:  [67] 67 (07/11 1008) Resp:  [20] 20 (07/11 1008) BP: (138)/(97) 138/97 (07/11 1008) SpO2:  [98 %] 98 % (07/11 1008) Weight:  [81.6 kg] 81.6 kg (07/11 1008)   General:   Alert,  Well-developed, well-nourished, pleasant and cooperative in NAD Head:  Normocephalic and atraumatic. Eyes:  Sclera clear, no icterus.   Conjunctiva pink. Ears:  Normal auditory acuity. Nose:  No deformity, discharge,  or lesions. Mouth:  No deformity or lesions, dentition normal. Neck:  Supple; no masses or thyromegaly. Lungs:  Clear throughout to auscultation.   No wheezes, crackles, or rhonchi. No acute distress. Heart:  Regular rate and rhythm; no murmurs, clicks, rubs,  or gallops. Abdomen:  Soft, nontender and nondistended. No masses, hepatosplenomegaly or hernias noted. Normal bowel sounds, without guarding, and without rebound.   Msk:  Symmetrical without gross deformities. Normal posture. Extremities:  Without clubbing or edema. Neurologic:  Alert and  oriented x4;  grossly normal neurologically. Skin:  Intact without significant lesions or rashes. Cervical Nodes:  No significant cervical adenopathy. Psych:  Alert and cooperative. Normal mood and affect.   Impression/Plan: Charles [name] is here for an EGD to be performed for epigastric pain, N/V, heartburn  Risks, benefits, limitations, imponderables and alternatives regarding EGD have been reviewed with the patient. Questions have been answered. All parties agreeable.

## 2021-12-06 NOTE — Op Note (Signed)
[facility] Patient Name: Charles [name] Procedure Date: 12/06/2021 10:53 AM MRN: [id number] Date of Birth: Feb 08, 1966 Attending MD: Elon Alas. Abbey Chatters DO CSN: [id number] Age: 56 Admit Type: Outpatient Procedure:                Upper GI endoscopy Indications:              Epigastric abdominal pain, Heartburn, Nausea Providers:                Elon Alas. Abbey Chatters, DO, Tammy Vaught, RN, [place]                            Theda Sers RN, RN, Ever[facility] [place] Referring MD:              Medicines:                See the Anesthesia note for documentation of the                            administered medications Complications:            No immediate complications. Estimated Blood Loss:     Estimated blood loss was minimal. Procedure:                Pre-Anesthesia Assessment:                           - The anesthesia plan was to use monitored                            anesthesia care (MAC).                           After obtaining informed consent, the endoscope was                            passed under direct vision. Throughout the                            procedure, the patient's blood pressure, pulse, and                            oxygen saturations were monitored continuously. The                            [id number]-[id number] ([id number]) scope was introduced through the                            mouth, and advanced to the second part of duodenum.                            The upper GI endoscopy was accomplished without                            difficulty. The patient tolerated the procedure  well. Scope In: 11:04:23 AM Scope Out: 11:08:39 AM Total Procedure Duration: 0 hours 4 minutes 16 seconds  Findings:      There were esophageal mucosal changes suspicious for short-segment       Barrett's esophagus present at the gastroesophageal junction. The       maximum longitudinal extent of these mucosal changes was 1 cm in length.       Mucosa was biopsied with a  cold forceps for histology. One specimen       bottle was sent to pathology.      Patchy mild inflammation characterized by erythema was found in the       gastric body. Biopsies were taken with a cold forceps for Helicobacter       pylori testing.      The duodenal bulb, first portion of the duodenum and second portion of       the duodenum were normal. Impression:               - Esophageal mucosal changes suspicious for                            short-segment Barrett's esophagus. Biopsied.                           - Gastritis. Biopsied.                           - Normal duodenal bulb, first portion of the                            duodenum and second portion of the duodenum. Moderate Sedation:      Per Anesthesia Care Recommendation:           - Patient has a contact number available for                            emergencies. The signs and symptoms of potential                            delayed complications were discussed with the                            patient. Return to normal activities tomorrow.                            Written discharge instructions were provided to the                            patient.                           - Resume previous diet.                           - Continue present medications.                           - Await pathology results.                           -  Repeat upper endoscopy in 5 years for                            surveillance.                           - Return to GI clinic in 4 months.                           - Use Prilosec (omeprazole) 40 mg PO BID for 12                            weeks. Procedure Code(s):        --- Professional ---                           [phone], Esophagogastroduodenoscopy, flexible,                            transoral; with biopsy, single or multiple Diagnosis Code(s):        --- Professional ---                           K22.8, Other specified diseases of esophagus                            K29.70, Gastritis, unspecified, without bleeding                           R10.13, Epigastric pain                           R12, Heartburn                           R11.0, Nausea CPT copyright 2019 American Medical Association. All rights reserved. The codes documented in this report are preliminary and upon coder review may  be revised to meet current compliance requirements. Elon Alas. Abbey Chatters, DO [place] Abbey Chatters, DO 12/06/2021 11:13:34 AM This report has been signed electronically. Number of Addenda: 0

## 2021-12-06 NOTE — Discharge Instructions (Signed)
EGD Discharge instructions Please read the instructions outlined below and refer to this sheet in the next few weeks. These discharge instructions provide you with general information on caring for yourself after you leave the hospital. Your doctor may also give you specific instructions. While your treatment has been planned according to the most current medical practices available, unavoidable complications occasionally occur. If you have any problems or questions after discharge, please call your doctor. ACTIVITY You may resume your regular activity but move at a slower pace for the next 24 hours.  Take frequent rest periods for the next 24 hours.  Walking will help expel (get rid of) the air and reduce the bloated feeling in your abdomen.  No driving for 24 hours (because of the anesthesia (medicine) used during the test).  You may shower.  Do not sign any important legal documents or operate any machinery for 24 hours (because of the anesthesia used during the test).  NUTRITION Drink plenty of fluids.  You may resume your normal diet.  Begin with a light meal and progress to your normal diet.  Avoid alcoholic beverages for 24 hours or as instructed by your caregiver.  MEDICATIONS You may resume your normal medications unless your caregiver tells you otherwise.  WHAT YOU CAN EXPECT TODAY You may experience abdominal discomfort such as a feeling of fullness or "gas" pains.  FOLLOW-UP Your doctor will discuss the results of your test with you.  SEEK IMMEDIATE MEDICAL ATTENTION IF ANY OF THE FOLLOWING OCCUR: Excessive nausea (feeling sick to your stomach) and/or vomiting.  Severe abdominal pain and distention (swelling).  Trouble swallowing.  Temperature over 101 F (37.8 C).  Rectal bleeding or vomiting of blood.   Your EGD showed findings suspicious for a condition called Barrett's esophagus.  I took biopsies.  Depending on pathology results, we may need to repeat EGD in 5 years.  You  do have inflammation in your stomach which I also biopsied to rule out infection with a bacteria called H. pylori.  Await pathology results, my office will contact you.  I am going to increase your omeprazole to 40 mg twice daily for the next 12 weeks.  Avoid NSAIDs as best as you can.  Follow-up with GI in 3 to 4 months.   I hope you have a great rest of your week!  Elon Alas. Abbey Chatters, D.O. Gastroenterology and Hepatology [facility]

## 2021-12-06 NOTE — Anesthesia Postprocedure Evaluation (Signed)
Anesthesia Post Note  Patient: Charles [name]  Procedure(s) Performed: ESOPHAGOGASTRODUODENOSCOPY (EGD) WITH PROPOFOL BIOPSY  Patient location during evaluation: Phase II Anesthesia Type: General Level of consciousness: awake and alert Pain management: pain level controlled Vital Signs Assessment: post-procedure vital signs reviewed and stable Respiratory status: spontaneous breathing, nonlabored ventilation, respiratory function stable and patient connected to nasal cannula oxygen Cardiovascular status: blood pressure returned to baseline and stable Postop Assessment: no apparent nausea or vomiting Anesthetic complications: no   There were no known notable events for this encounter.   Last Vitals:  Vitals:   12/06/21 1008 12/06/21 1112  BP: (!) 138/97 122/86  Pulse: 67 79  Resp: 20 17  Temp: 37.3 C 36.7 C  SpO2: 98% 98%    Last Pain:  Vitals:   12/06/21 1112  TempSrc: Oral  PainSc: 0-No pain                 [name]

## 2021-12-06 NOTE — Transfer of Care (Signed)
Immediate Anesthesia Transfer of Care Note  Patient: Charles [name]  Procedure(s) Performed: ESOPHAGOGASTRODUODENOSCOPY (EGD) WITH PROPOFOL BIOPSY  Patient Location: Endoscopy Unit  Anesthesia Type:General  Level of Consciousness: drowsy  Airway & Oxygen Therapy: Patient Spontanous Breathing  Post-op Assessment: Report given to RN and Post -op Vital signs reviewed and stable  Post vital signs: Reviewed and stable  Last Vitals:  Vitals Value Taken Time  BP 122/86   Temp    Pulse 74   Resp 13   SpO2 96%     Last Pain:  Vitals:   12/06/21 1101  TempSrc:   PainSc: 3          Complications: No notable events documented.

## 2021-12-06 NOTE — Anesthesia Preprocedure Evaluation (Signed)
Anesthesia Evaluation  Patient identified by MRN, date of birth, ID band Patient awake    Reviewed: Allergy & Precautions, H&P , NPO status , Patient's Chart, lab work & pertinent test results  Airway Mallampati: II  TM Distance: >3 FB   Mouth opening: Limited Mouth Opening  Dental  (+) Poor Dentition, Missing, Chipped, Dental Advisory Given   Pulmonary neg pulmonary ROS,    breath sounds clear to auscultation       Cardiovascular negative cardio ROS   Rhythm:Regular Rate:Normal     Neuro/Psych  Headaches,    GI/Hepatic Neg liver ROS, GERD  Medicated,  Endo/Other  negative endocrine ROS  Renal/GU negative Renal ROS     Musculoskeletal  (+) Arthritis ,   Abdominal   Peds  Hematology negative hematology ROS (+)   Anesthesia Other Findings   Reproductive/Obstetrics                             Anesthesia Physical  Anesthesia Plan  ASA: 2  Anesthesia Plan: General   Post-op Pain Management:    Induction: Intravenous  PONV Risk Score and Plan: 1 and TIVA  Airway Management Planned: Nasal Cannula  Additional Equipment:   Intra-op Plan:   Post-operative Plan:   Informed Consent: I have reviewed the patients History and Physical, chart, labs and discussed the procedure including the risks, benefits and alternatives for the proposed anesthesia with the patient or authorized representative who has indicated his/her understanding and acceptance.     Dental advisory given  Plan Discussed with: CRNA  Anesthesia Plan Comments:         Anesthesia Quick Evaluation

## 2021-12-07 LAB — SURGICAL PATHOLOGY

## 2021-12-12 ENCOUNTER — Other Ambulatory Visit: Payer: Self-pay | Admitting: Internal Medicine

## 2021-12-12 ENCOUNTER — Encounter (HOSPITAL_COMMUNITY): Payer: Self-pay | Admitting: Internal Medicine

## 2021-12-12 DIAGNOSIS — R12 Heartburn: Secondary | ICD-10-CM

## 2021-12-12 DIAGNOSIS — R110 Nausea: Secondary | ICD-10-CM

## 2021-12-12 DIAGNOSIS — R1013 Epigastric pain: Secondary | ICD-10-CM

## 2021-12-12 NOTE — Telephone Encounter (Signed)
Dr. Abbey Chatters recommended PPI BID x 12 weeks at the time of recent EGD. Please complete PA for omeprazole 40 mg BID.

## 2021-12-12 NOTE — Telephone Encounter (Signed)
Pt's last ov was 10-27-2021

## 2021-12-13 NOTE — Telephone Encounter (Signed)
noted 

## 2021-12-14 ENCOUNTER — Telehealth: Payer: Self-pay

## 2021-12-14 NOTE — Telephone Encounter (Signed)
Note PA done for Omeprazole 40 mg. Dx used: GERD, R12.0, R11.0, R10.13. pt has tried/failed: Dexilant, Omeprazole 20 mg and Pepcid OTC. Waiting on response from [employer].

## 2021-12-14 NOTE — Telephone Encounter (Signed)
Pt approved for Omeprazole 40 mg through 12/15/2022. Phoned and LMOVM for the pt regarding his Omeprazole approved. Phoned the pt's pharmacy and advised the pharmacy of the same thing. Will give to Manuela Schwartz to scan to chart

## 2021-12-14 NOTE — Telephone Encounter (Signed)
PA done for Omeprazole 40 mg. Dx used: GERD, R12.0, R11.0, R10.13. pt has tried/failed: Dexilant, Omeprazole 20 mg and Pepcid OTC. Waiting on response from Cover [employer].

## 2021-12-15 NOTE — Telephone Encounter (Signed)
Note Pt approved for Omeprazole 40 mg through 12/15/2022. Phoned and LMOVM for the pt regarding his Omeprazole approved. Phoned the pt's pharmacy and advised the pharmacy of the same thing. Will give to Manuela Schwartz to scan to chart

## 2021-12-16 NOTE — Telephone Encounter (Signed)
Phoned and LMOVM for the pt to call. Phoned [facility] and advised we talked yesterday pt has to go through [employer] or mail order per insurance in order to get Rx for 2 per day. This has been approved. Pt has been LMOVM X 2 waiting on pt to return call. Detailed message was left

## 2021-12-19 NOTE — Telephone Encounter (Signed)
Letter mailed to the pt to contact the ov. Pt has not responded to vm's left.

## 2021-12-23 ENCOUNTER — Telehealth: Payer: Self-pay | Admitting: *Deleted

## 2021-12-23 ENCOUNTER — Other Ambulatory Visit: Payer: Self-pay | Admitting: Gastroenterology

## 2021-12-23 DIAGNOSIS — R1013 Epigastric pain: Secondary | ICD-10-CM

## 2021-12-23 DIAGNOSIS — R12 Heartburn: Secondary | ICD-10-CM

## 2021-12-23 DIAGNOSIS — R110 Nausea: Secondary | ICD-10-CM

## 2021-12-23 MED ORDER — OMEPRAZOLE 40 MG PO CPDR: 40.0000 mg | DELAYED_RELEASE_CAPSULE | Freq: Two times a day (BID) | ORAL | 3 refills | Status: AC

## 2021-12-23 NOTE — Telephone Encounter (Signed)
Spoke to pt' significant other Albina Billet [facility]) she informed me that she would like Omeprazole sent to [facility] on [address] in [place].

## 2021-12-23 NOTE — Telephone Encounter (Signed)
Noted  

## 2021-12-23 NOTE — Telephone Encounter (Signed)
Rx sent 

## 2022-01-11 ENCOUNTER — Ambulatory Visit
Admission: RE | Admit: 2022-01-11 | Discharge: 2022-01-11 | Disposition: A | Discharge status: Home / Self Care | Payer: Managed Care, Other (non HMO) | Source: Ambulatory Visit | Attending: Emergency Medicine | Admitting: Emergency Medicine

## 2022-01-11 VITALS — BP 139/89 | HR 82 | Temp 98.1°F | Resp 18

## 2022-01-11 DIAGNOSIS — R1030 Lower abdominal pain, unspecified: Secondary | ICD-10-CM | POA: Diagnosis present

## 2022-01-11 DIAGNOSIS — R350 Frequency of micturition: Secondary | ICD-10-CM | POA: Diagnosis present

## 2022-01-11 DIAGNOSIS — R300 Dysuria: Secondary | ICD-10-CM

## 2022-01-11 DIAGNOSIS — M545 Low back pain, unspecified: Secondary | ICD-10-CM

## 2022-01-11 LAB — POCT URINALYSIS DIP (MANUAL ENTRY)
Bilirubin, UA: NEGATIVE
Blood, UA: NEGATIVE
Glucose, UA: NEGATIVE mg/dL
Ketones, POC UA: NEGATIVE mg/dL
Leukocytes, UA: NEGATIVE
Nitrite, UA: NEGATIVE
Protein Ur, POC: 30 mg/dL — AB
Spec Grav, UA: >=1.03 — AB (ref 1.010–1.025)
Urobilinogen, UA: 0.2 E.U./dL
pH, UA: 5.5 (ref 5.0–8.0)

## 2022-01-11 NOTE — ED Provider Notes (Signed)
[facility]    CSN: [id number] Arrival date & time: 01/11/22  1608    HISTORY   Chief Complaint  Patient presents with   Urinary Frequency    Pain with urination. Lower abdominal pain. - Entered by patient   HPI Charles [name] is a pleasant, 56 y.o. male who presents to urgent care today. Patient is here with his male partner today.  Patient complains of midline lower back pain, abdominal cramping not associated with with bowel movements and burning when he urinates.  Patient states the symptoms began about a week ago and have become progressively worse.  Patient states he has not tried any interventions at home for his symptoms.  Patient states he is not very good about drinking water every day.  Patient denies penile discharge, genital lesions, known STD exposure.  Patient also politely declines STD screening today.  The history is provided by the patient.   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   Dyspepsia    Nausea without vomiting 06/18/2014   Abdominal pain, epigastric 01/31/2012   Bowel habit changes 01/31/2012   Left sided abdominal pain 01/31/2012   Past Surgical History:  Procedure Laterality Date   BIOPSY  12/06/2021   Procedure: BIOPSY;  Surgeon: Eloise Harman, DO;  [facility];  Service: Endoscopy;;   COLONOSCOPY  03/05/2012   TSV:XBLTJ internal hemorrhoids/Two sessile polyps HYPERPLASTIC   ESOPHAGOGASTRODUODENOSCOPY  03/05/2012   SLF:Non-erosive gastritis (inflammation), negative H.pylori, s/p empiric Savary dilation   ESOPHAGOGASTRODUODENOSCOPY N/A 06/26/2014   Procedure: ESOPHAGOGASTRODUODENOSCOPY (EGD);  Surgeon: Danie Binder, MD;  Location: [facility];   Service: Endoscopy;  Laterality: N/A;  200 - moved to 12:00 - Ginger notified pt   ESOPHAGOGASTRODUODENOSCOPY (EGD) WITH PROPOFOL N/A 12/06/2021   Procedure: ESOPHAGOGASTRODUODENOSCOPY (EGD) WITH PROPOFOL;  Surgeon: Eloise Harman, DO;  Location: [facility];  Service: Endoscopy;  Laterality: N/A;  11:15am   INGUINAL HERNIA REPAIR Right 05/09/2013   Procedure: HERNIA REPAIR INGUINAL ADULT;  Surgeon: Jamesetta So, MD;  Location: [facility];  Service: General;  Laterality: Right;    Home Medications    Prior to Admission medications   Medication Sig Start Date End Date Taking? Authorizing Provider  Multiple Vitamins-Minerals (MULTIVITAMIN WITH MINERALS) tablet Take 1 tablet by mouth daily.    [provider]  omeprazole (PRILOSEC) 40 MG capsule Take 1 capsule (40 mg total) by mouth 2 (two) times daily. 12/23/21   Erenest Rasher, PA-C    Family History Family History  Problem Relation Age of Onset   Hypertension Mother    Diabetes Mother    COPD Mother    Arthritis Mother    Stroke Father    Hypertension Father    Hyperlipidemia Father    Asthma Daughter    Colon cancer Neg Hx    Inflammatory bowel disease Neg Hx    Liver disease Neg Hx    Social History Social History   Tobacco Use   Smoking status: Never   Smokeless tobacco: Never  Vaping Use   Vaping Use: Never used  Substance Use Topics   Alcohol use: Not Currently    Comment: vodka, twice per week, 1  cup in two mixed drinks. (as of 11/11 no etoh in about a month)   Drug use: No   Allergies   Patient has no known allergies.  Review of Systems Review of Systems Pertinent findings revealed after performing a 14 point review of systems has been noted in the history of present illness.  Physical Exam Triage Vital Signs ED Triage Vitals  Enc Vitals Group     BP 03/25/21 0827 (!) 147/82     Pulse Rate 03/25/21 0827 72     Resp 03/25/21 0827 18     Temp 03/25/21 0827 98.3 F (36.8 C)     Temp Source  03/25/21 0827 Oral     SpO2 03/25/21 0827 98 %     Weight --      Height --      Head Circumference --      Peak Flow --      Pain Score 03/25/21 0826 5     Pain Loc --      Pain Edu? --      Excl. in [place]? --   No data found.  Updated Vital Signs BP 139/89 (BP Location: Right Arm)   Pulse 82   Temp 98.1 F (36.7 C) (Oral)   Resp 18   SpO2 97%   Physical Exam Vitals and nursing note reviewed.  Constitutional:      General: He is not in acute distress.    Appearance: Normal appearance. He is not ill-appearing.  HENT:     Head: Normocephalic and atraumatic.  Eyes:     General: Lids are normal.        Right eye: No discharge.        Left eye: No discharge.     Extraocular Movements: Extraocular movements intact.     Conjunctiva/sclera: Conjunctivae normal.     Right eye: Right conjunctiva is not injected.     Left eye: Left conjunctiva is not injected.  Neck:     Trachea: Trachea and phonation normal.  Cardiovascular:     Rate and Rhythm: Normal rate and regular rhythm.     Pulses: Normal pulses.     Heart sounds: Normal heart sounds. No murmur heard.    No friction rub. No gallop.  Pulmonary:     Effort: Pulmonary effort is normal. No accessory muscle usage, prolonged expiration or respiratory distress.     Breath sounds: Normal breath sounds. No stridor, decreased air movement or transmitted upper airway sounds. No decreased breath sounds, wheezing, rhonchi or rales.  Chest:     Chest wall: No tenderness.  Musculoskeletal:        General: Normal range of motion.     Cervical back: Normal range of motion and neck supple. Normal range of motion.  Lymphadenopathy:     Cervical: No cervical adenopathy.  Skin:    General: Skin is warm and dry.     Findings: No erythema or rash.  Neurological:     General: No focal deficit present.     Mental Status: He is alert and oriented to person, place, and time.  Psychiatric:        Mood and Affect: Mood normal.         Behavior: Behavior normal.     Visual Acuity Right Eye Distance:   Left Eye Distance:   Bilateral Distance:    Right Eye Near:   Left Eye Near:    Bilateral Near:     UC Couse / Diagnostics /  Procedures:     Radiology No results found.  Procedures Procedures (including critical care time) EKG  Pending results:  Labs Reviewed  POCT URINALYSIS DIP (MANUAL ENTRY) - Abnormal; Notable for the following components:      Result Value   Spec Grav, UA >=1.030 (*)    Protein Ur, POC =30 (*)    All other components within normal limits    Medications Ordered in UC: Medications - No data to display  UC Diagnoses / Final Clinical Impressions(s)   I have reviewed the triage vital signs and the nursing notes.  Pertinent labs & imaging results that were available during my care of the patient were reviewed by me and considered in my medical decision making (see chart for details).    Final diagnoses:  Increased frequency of urination  Dysuria  Acute low back pain without sciatica, unspecified back pain laterality  Lower abdominal pain    Urinalysis is unremarkable.  Patient again politely declines STD screening.  We will send urine for culture and treat as needed based on results.  Patient further advised that if his symptoms continue to worsen despite negative urine culture that he should follow-up with his primary care provider for further evaluation.  ED Prescriptions   None    PDMP not reviewed this encounter.  Disposition Upon Discharge:  Condition: stable for discharge home  Patient presented with concern for an acute illness with associated systemic symptoms and significant discomfort requiring urgent management. In my opinion, this is a condition that a prudent lay person (someone who possesses an average knowledge of health and medicine) may potentially expect to result in complications if not addressed urgently such as respiratory distress, impairment of bodily  function or dysfunction of bodily organs.   As such, the patient has been evaluated and assessed, work-up was performed and treatment was provided in alignment with urgent care protocols and evidence based medicine.  Patient/parent/caregiver has been advised that the patient may require follow up for further testing and/or treatment if the symptoms continue in spite of treatment, as clinically indicated and appropriate.  Routine symptom specific, illness specific and/or disease specific instructions were discussed with the patient and/or caregiver at length.  Prevention strategies for avoiding STD exposure were also discussed.  The patient will follow up with their current PCP if and as advised. If the patient does not currently have a PCP we will assist them in obtaining one.   The patient may need specialty follow up if the symptoms continue, in spite of conservative treatment and management, for further workup, evaluation, consultation and treatment as clinically indicated and appropriate.  Patient/parent/caregiver verbalized understanding and agreement of plan as discussed.  All questions were addressed during visit.  Please see discharge instructions below for further details of plan.  Discharge Instructions:   Discharge Instructions      The urinalysis that we performed in the clinic today was essentially normal.  Urine culture will be performed because you are reporting symptoms of a urinary tract infection.  The result of the urine culture will be available in the next 3 to 5 days and will be posted to your MyChart account.  If there is an abnormal finding, you will be contacted by phone and advised of further treatment recommendations, if any.   If treatment is recommended and you have not had complete resolution of your symptoms after completing treatment as prescribed, please return to urgent care for repeat evaluation or follow-up with your primary care provider.  Thank you for  visiting urgent care today.  I appreciate the opportunity to participate in your care.     This office note has been dictated using Museum/gallery curator.  Unfortunately, this method of dictation can sometimes lead to typographical or grammatical errors.  I apologize for your inconvenience in advance if this occurs.  Please do not hesitate to reach out to me if clarification is needed.       Lynden Oxford Scales, [place] 01/11/22 [phone]

## 2022-01-11 NOTE — ED Triage Notes (Signed)
The pt c/o lower back/ abd pain and  burning with urination.   Started: about a week ago   Home interventions:

## 2022-01-11 NOTE — Discharge Instructions (Addendum)
The urinalysis that we performed in the clinic today was essentially normal.  Urine culture will be performed because you are reporting symptoms of a urinary tract infection.  The result of the urine culture will be available in the next 3 to 5 days and will be posted to your MyChart account.  If there is an abnormal finding, you will be contacted by phone and advised of further treatment recommendations, if any.   If treatment is recommended and you have not had complete resolution of your symptoms after completing treatment as prescribed, please return to urgent care for repeat evaluation or follow-up with your primary care provider.   Thank you for visiting urgent care today.  I appreciate the opportunity to participate in your care.

## 2022-01-12 LAB — URINE CULTURE: Culture: NO GROWTH

## 2022-02-13 ENCOUNTER — Encounter: Payer: Self-pay | Admitting: *Deleted

## 2022-03-20 ENCOUNTER — Ambulatory Visit: Payer: Managed Care, Other (non HMO) | Admitting: Gastroenterology

## 2022-03-23 ENCOUNTER — Telehealth: Payer: Self-pay | Admitting: Internal Medicine

## 2022-03-23 NOTE — Telephone Encounter (Signed)
This patient is scheduled for 11/16 and I am working to get him rescheduled and he needs something after 3 pm.  Can I use a new patient slot to get him?  It looks like that is what he was originally scheduled in the first time but wanted to ask

## 2022-03-23 NOTE — Telephone Encounter (Signed)
Dr. Abbey Chatters Pt wants appt after 3pm on 11/16. [facility] staff wants to know can new pt slot be used.

## 2022-03-30 NOTE — Telephone Encounter (Signed)
Yes okay to schedule whenever.  Thanks

## 2022-03-31 NOTE — Telephone Encounter (Signed)
Per Dr. Abbey Chatters it is ok to schedule this pt whenever

## 2022-04-05 ENCOUNTER — Ambulatory Visit: Payer: Managed Care, Other (non HMO) | Admitting: Family Medicine

## 2022-04-13 ENCOUNTER — Ambulatory Visit: Payer: Managed Care, Other (non HMO) | Admitting: Internal Medicine

## 2022-04-19 ENCOUNTER — Ambulatory Visit: Payer: Managed Care, Other (non HMO) | Admitting: Family Medicine

## 2022-05-03 ENCOUNTER — Encounter: Payer: Self-pay | Admitting: Family Medicine

## 2022-05-03 ENCOUNTER — Ambulatory Visit (INDEPENDENT_AMBULATORY_CARE_PROVIDER_SITE_OTHER): Payer: Managed Care, Other (non HMO) | Admitting: Family Medicine

## 2022-05-03 VITALS — BP 149/91 | HR 91 | Temp 98.1°F | Ht 75.0 in | Wt 186.4 lb

## 2022-05-03 DIAGNOSIS — R399 Unspecified symptoms and signs involving the genitourinary system: Secondary | ICD-10-CM

## 2022-05-03 LAB — MICROSCOPIC EXAMINATION
Bacteria, UA: NONE SEEN
RBC, Urine: NONE SEEN /hpf (ref 0–2)
Renal Epithel, UA: NONE SEEN /hpf

## 2022-05-03 LAB — URINALYSIS, ROUTINE W REFLEX MICROSCOPIC
Bilirubin, UA: NEGATIVE
Glucose, UA: NEGATIVE
Ketones, UA: NEGATIVE
Leukocytes,UA: NEGATIVE
Nitrite, UA: NEGATIVE
RBC, UA: NEGATIVE
Specific Gravity, UA: 1.015 (ref 1.005–1.030)
Urobilinogen, Ur: 0.2 mg/dL (ref 0.2–1.0)
pH, UA: 6.5 (ref 5.0–7.5)

## 2022-05-03 NOTE — Patient Instructions (Signed)

## 2022-05-03 NOTE — Progress Notes (Signed)
   Established Patient Office Visit  Subjective   Patient ID: Charles [name], male    DOB: 08/14/1965  Age: 56 y.o. MRN: [id number]  Chief Complaint  Patient presents with   Urinary Frequency    HPI Charles [name] has noticed urinary frequency off and on for years. This seems to have been more consistent for the last few months. He also reports feeling like he doesn't empty his bladder completely as well.  He also reports some hesitancy with voiding at night. Some hesitancy at night. Denies pain, hematuria, nocturia, dysuria, swelling, lesions, or concerns for STIs.  His father had an enlarged prostrate, he isn't sure if he had prostate cancer or not.     ROS As per HPI.    Objective:     BP (!) 149/91   Pulse 91   Temp 98.1 F (36.7 C) (Temporal)   Ht '6\' 3"'$  (1.905 m)   Wt 186 lb 6 oz (84.5 kg)   SpO2 98%   BMI 23.30 kg/m    Physical Exam Vitals and nursing note reviewed.  Constitutional:      General: He is not in acute distress.    Appearance: He is not ill-appearing, toxic-appearing or diaphoretic.  Cardiovascular:     Rate and Rhythm: Regular rhythm.     Heart sounds: Normal heart sounds. No murmur heard. Pulmonary:     Effort: Pulmonary effort is normal. No respiratory distress.     Breath sounds: Normal breath sounds.  Abdominal:     General: Bowel sounds are normal. There is no distension.     Palpations: Abdomen is soft.     Tenderness: There is no abdominal tenderness. There is no guarding or rebound.  Musculoskeletal:     Right lower leg: No edema.     Left lower leg: No edema.  Skin:    General: Skin is warm and dry.  Neurological:     General: No focal deficit present.     Mental Status: He is alert and oriented to person, place, and time.  Psychiatric:        Mood and Affect: Mood normal.        Behavior: Behavior normal.      No results found for any visits on 05/03/22.    The 10-year ASCVD risk score (Arnett DK, et al., 2019) is: 9.4%     Assessment & Plan:   Charles [name] was seen today for urinary frequency.  Diagnoses and all orders for this visit:  Lower urinary tract symptoms Will check UA and PSA today. Discussed referral to urology pending results.  -     Urinalysis, Routine w reflex microscopic -     PSA, total and free  Return for schedule CPE.   The patient indicates understanding of these issues and agrees with the plan.  Gwenlyn Perking, FNP

## 2022-05-04 ENCOUNTER — Other Ambulatory Visit: Payer: Self-pay | Admitting: Family Medicine

## 2022-05-04 DIAGNOSIS — R399 Unspecified symptoms and signs involving the genitourinary system: Secondary | ICD-10-CM

## 2022-05-04 LAB — PSA, TOTAL AND FREE
PSA, Free Pct: 56.3 %
PSA, Free: 0.45 ng/mL
Prostate Specific Ag, Serum: 0.8 ng/mL (ref 0.0–4.0)

## 2022-05-04 MED ORDER — TAMSULOSIN HCL 0.4 MG PO CAPS: 0.4000 mg | ORAL_CAPSULE | Freq: Every day | ORAL | 3 refills | Status: DC

## 2022-05-06 NOTE — Progress Notes (Deleted)
GI Office Note    Referring Provider: Gwenlyn Perking, FNP Primary Care Physician:  Gwenlyn Perking, FNP Primary Gastroenterologist: Elon Alas. Abbey Chatters, DO   Date:  05/06/2022  ID:  Charles [name], DOB 1966/04/08, MRN [id number]   Chief Complaint   No chief complaint on file.   History of Present Illness  Charles [name] is a 56 y.o. 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 ***  Last colonoscopy in October 2013 with 2 hyperplastic polyps.  Last seen in the office 10/27/2021 for epigastric pain, nausea without vomiting, GERD, and alternating bowel habits.  Limited ongoing dull epigastric pain with associated nausea and increased over the last 6 months to 1 year.  # Improvement of symptoms with OTC omeprazole that he began in April.  Admitted to 800 mg ibuprofen use daily for joint pain.  Also reported skipping a day between bowel movements and then may have diarrhea about once a week with 1-3 mostly stools a day.  No alarm symptoms.  Suspected to be secondary to poor dietary habits.  Was recommended to add Benefiber to his diet, increase omeprazole to 40 mg daily, avoid NSAIDs, follow GERD diet, and scheduled for EGD.  EGD 12/06/2021: -Esophageal mucosa suspicious for Barrett's s/p biopsy (nonspecific inflammation) -Gastritis s/p biopsy (minimal vascular ectasia) -Normal duodenum -Advised omeprazole 40 mg twice daily for 3 months -Repeat EGD in 5 years for surveillance   Today: GERD -    Current Outpatient Medications  Medication Sig Dispense Refill   tamsulosin (FLOMAX) 0.4 MG CAPS capsule Take 1 capsule (0.4 mg total) by mouth daily. 30 capsule 3   Multiple Vitamins-Minerals (MULTIVITAMIN WITH MINERALS) tablet Take 1 tablet by mouth daily.     omeprazole (PRILOSEC) 40 MG capsule Take 1 capsule (40 mg total) by mouth 2 (two) times daily. 60  capsule 3   No current facility-administered medications for this visit.    Past Medical History:  Diagnosis Date   Abdominal pain    Arthritis    Constipation    GERD (gastroesophageal reflux disease)    Headache(784.0)     Past Surgical History:  Procedure Laterality Date   BIOPSY  12/06/2021   Procedure: BIOPSY;  Surgeon: Eloise Harman, DO;  Location: [facility];  Service: Endoscopy;;   COLONOSCOPY  03/05/2012   FUX:NATFT internal hemorrhoids/Two sessile polyps HYPERPLASTIC   ESOPHAGOGASTRODUODENOSCOPY  03/05/2012   SLF:Non-erosive gastritis (inflammation), negative H.pylori, s/p empiric Savary dilation   ESOPHAGOGASTRODUODENOSCOPY N/A 06/26/2014   Procedure: ESOPHAGOGASTRODUODENOSCOPY (EGD);  Surgeon: Danie Binder, MD;  Location: [facility];  Service: Endoscopy;  Laterality: N/A;  200 - moved to 12:00 - Ginger notified pt   ESOPHAGOGASTRODUODENOSCOPY (EGD) WITH PROPOFOL N/A 12/06/2021   Procedure: ESOPHAGOGASTRODUODENOSCOPY (EGD) WITH PROPOFOL;  Surgeon: Eloise Harman, DO;  Location: [facility];  Service: Endoscopy;  Laterality: N/A;  11:15am   INGUINAL HERNIA REPAIR Right 05/09/2013   Procedure: HERNIA REPAIR INGUINAL ADULT;  Surgeon: Jamesetta So, MD;  Location: [facility];  Service: General;  Laterality: Right;    Family History  Problem Relation Age of Onset   Hypertension Mother    Diabetes Mother    COPD Mother    Arthritis Mother    Stroke Father    Hypertension Father    Hyperlipidemia Father    Asthma Daughter    Colon cancer Neg Hx  Inflammatory bowel disease Neg Hx    Liver disease Neg Hx     Allergies as of 05/08/2022   (No Known Allergies)    Social History   Socioeconomic History   Marital status: Single    Spouse name: Not on file   Number of children: 4   Years of education: 12   Highest education level: High school graduate  Occupational History   Occupation: [place]     Employer: [employer]  Tobacco Use   Smoking status: Never   Smokeless tobacco: Never  Vaping Use   Vaping Use: Never used  Substance and Sexual Activity   Alcohol use: Not Currently    Comment: vodka, twice per week, 1 cup in two mixed drinks. (as of 11/11 no etoh in about a month)   Drug use: No   Sexual activity: Yes    Birth control/protection: None  Other Topics Concern   Not on file  Social History Narrative   Not on file   Social Determinants of Health   Financial Resource Strain: Not on file  Food Insecurity: Not on file  Transportation Needs: Not on file  Physical Activity: Not on file  Stress: Not on file  Social Connections: Not on file     Review of Systems   Gen: Denies fever, chills, anorexia. Denies fatigue, weakness, weight loss.  CV: Denies chest pain, palpitations, syncope, peripheral edema, and claudication. Resp: Denies dyspnea at rest, cough, wheezing, coughing up blood, and pleurisy. GI: See HPI Derm: Denies rash, itching, dry skin Psych: Denies depression, anxiety, memory loss, confusion. No homicidal or suicidal ideation.  Heme: Denies bruising, bleeding, and enlarged lymph nodes.   Physical Exam   There were no vitals taken for this visit.  General:   Alert and oriented. No distress noted. Pleasant and cooperative.  Head:  Normocephalic and atraumatic. Eyes:  Conjuctiva clear without scleral icterus. Mouth:  Oral mucosa pink and moist. Good dentition. No lesions. Lungs:  Clear to auscultation bilaterally. No wheezes, rales, or rhonchi. No distress.  Heart:  S1, S2 present without murmurs appreciated.  Abdomen:  +BS, soft, non-tender and non-distended. No rebound or guarding. No HSM or masses noted. Rectal: *** Msk:  Symmetrical without gross deformities. Normal posture. Extremities:  Without edema. Neurologic:  Alert and  oriented x4 Psych:  Alert and cooperative. Normal mood and affect.   Assessment  Charles [name] is a 56 y.o. 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 ***  GERD, epigastric pain: History of eosinophilic duodenitis in 7672 treated with prednisone.  Recent EGD in July with mild nonspecific esophagitis and gastritis.  Initially advised to take PPI twice daily for 3 months, then reduce back to once daily.  Due for surveillance EGD in 2028.   Alternating bowel habits:   Colon cancer screening: Last colonoscopy in October 2013 with 2 hyperplastic polyps.  Currently due for screening.  PLAN   *** Continue omeprazole 40 mg daily Avoid NSAIDs GERD diet/lifestyle modifications     Venetia Night, MSN, FNP-BC, AGACNP-BC [facility]

## 2022-05-07 ENCOUNTER — Emergency Department (HOSPITAL_BASED_OUTPATIENT_CLINIC_OR_DEPARTMENT_OTHER): Payer: Managed Care, Other (non HMO)

## 2022-05-07 ENCOUNTER — Encounter (HOSPITAL_BASED_OUTPATIENT_CLINIC_OR_DEPARTMENT_OTHER): Payer: Self-pay | Admitting: Emergency Medicine

## 2022-05-07 ENCOUNTER — Other Ambulatory Visit: Payer: Self-pay

## 2022-05-07 ENCOUNTER — Emergency Department (HOSPITAL_BASED_OUTPATIENT_CLINIC_OR_DEPARTMENT_OTHER)
Admission: EM | Admit: 2022-05-07 | Discharge: 2022-05-08 | Disposition: A | Discharge status: Home / Self Care | Payer: Managed Care, Other (non HMO) | Attending: Emergency Medicine | Admitting: Emergency Medicine

## 2022-05-07 DIAGNOSIS — G44209 Tension-type headache, unspecified, not intractable: Secondary | ICD-10-CM | POA: Diagnosis not present

## 2022-05-07 DIAGNOSIS — R079 Chest pain, unspecified: Secondary | ICD-10-CM

## 2022-05-07 DIAGNOSIS — D649 Anemia, unspecified: Secondary | ICD-10-CM | POA: Insufficient documentation

## 2022-05-07 DIAGNOSIS — R700 Elevated erythrocyte sedimentation rate: Secondary | ICD-10-CM | POA: Insufficient documentation

## 2022-05-07 DIAGNOSIS — R519 Headache, unspecified: Secondary | ICD-10-CM | POA: Diagnosis present

## 2022-05-07 LAB — CBC WITH DIFFERENTIAL/PLATELET
Abs Immature Granulocytes: 0.08 10*3/uL — ABNORMAL HIGH (ref 0.00–0.07)
Basophils Absolute: 0 10*3/uL (ref 0.0–0.1)
Basophils Relative: 1 %
Eosinophils Absolute: 0.1 10*3/uL (ref 0.0–0.5)
Eosinophils Relative: 2 %
HCT: 36.1 % — ABNORMAL LOW (ref 39.0–52.0)
Hemoglobin: 12 g/dL — ABNORMAL LOW (ref 13.0–17.0)
Immature Granulocytes: 1 %
Lymphocytes Relative: 26 %
Lymphs Abs: 1.5 10*3/uL (ref 0.7–4.0)
MCH: 29.3 pg (ref 26.0–34.0)
MCHC: 33.2 g/dL (ref 30.0–36.0)
MCV: 88 fL (ref 80.0–100.0)
Monocytes Absolute: 0.6 10*3/uL (ref 0.1–1.0)
Monocytes Relative: 11 %
Neutro Abs: 3.3 10*3/uL (ref 1.7–7.7)
Neutrophils Relative %: 59 %
Platelets: 251 10*3/uL (ref 150–400)
RBC: 4.1 MIL/uL — ABNORMAL LOW (ref 4.22–5.81)
RDW: 12.3 % (ref 11.5–15.5)
WBC: 5.6 10*3/uL (ref 4.0–10.5)
nRBC: 0 % (ref 0.0–0.2)

## 2022-05-07 LAB — RAPID URINE DRUG SCREEN, HOSP PERFORMED
Amphetamines: NOT DETECTED
Barbiturates: NOT DETECTED
Benzodiazepines: NOT DETECTED
Cocaine: NOT DETECTED
Opiates: NOT DETECTED
Tetrahydrocannabinol: NOT DETECTED

## 2022-05-07 LAB — BASIC METABOLIC PANEL
Anion gap: 8 (ref 5–15)
BUN: 14 mg/dL (ref 6–20)
CO2: 32 mmol/L (ref 22–32)
Calcium: 8.9 mg/dL (ref 8.9–10.3)
Chloride: 100 mmol/L (ref 98–111)
Creatinine, Ser: 0.82 mg/dL (ref 0.61–1.24)
GFR, Estimated: >60 mL/min (ref >60)
Glucose, Bld: 96 mg/dL (ref 70–99)
Potassium: 3.9 mmol/L (ref 3.5–5.1)
Sodium: 140 mmol/L (ref 135–145)

## 2022-05-07 LAB — SEDIMENTATION RATE: Sed Rate: 21 mm/hr — ABNORMAL HIGH (ref 0–16)

## 2022-05-07 LAB — TROPONIN I (HIGH SENSITIVITY): Troponin I (High Sensitivity): 4 ng/L (ref <18)

## 2022-05-07 MED ORDER — DIPHENHYDRAMINE HCL 50 MG/ML IJ SOLN
12.5000 mg | Freq: Once | INTRAMUSCULAR | Status: AC
  Administered 2022-05-07: 12.5 mg via INTRAVENOUS
  Filled 2022-05-07: qty 1

## 2022-05-07 MED ORDER — METOCLOPRAMIDE HCL 5 MG/ML IJ SOLN
10.0000 mg | Freq: Once | INTRAMUSCULAR | Status: AC
  Administered 2022-05-07: 10 mg via INTRAVENOUS
  Filled 2022-05-07: qty 2

## 2022-05-07 MED ORDER — ACETAMINOPHEN 500 MG PO TABS
1000.0000 mg | ORAL_TABLET | Freq: Once | ORAL | Status: AC
  Administered 2022-05-07: 1000 mg via ORAL
  Filled 2022-05-07: qty 2

## 2022-05-07 MED ORDER — LIDOCAINE VISCOUS HCL 2 % MT SOLN
15.0000 mL | Freq: Once | OROMUCOSAL | Status: AC
  Administered 2022-05-07: 15 mL via ORAL
  Filled 2022-05-07: qty 15

## 2022-05-07 MED ORDER — SODIUM CHLORIDE 0.9 % IV SOLN: INTRAVENOUS | Status: DC

## 2022-05-07 MED ORDER — ALUM & MAG HYDROXIDE-SIMETH 200-200-20 MG/5ML PO SUSP
30.0000 mL | Freq: Once | ORAL | Status: AC
  Administered 2022-05-07: 30 mL via ORAL
  Filled 2022-05-07: qty 30

## 2022-05-07 MED ORDER — SODIUM CHLORIDE 0.9 % IV BOLUS
1000.0000 mL | Freq: Once | INTRAVENOUS | Status: AC
  Administered 2022-05-07: 1000 mL via INTRAVENOUS

## 2022-05-07 NOTE — ED Triage Notes (Signed)
Pt presents to ED POV. Pt c/o HA and HTN x5d. Pt reports that highest 152/112. HA is generalized and 5/10.

## 2022-05-07 NOTE — ED Provider Notes (Signed)
[place] EMERGENCY DEPT Provider Note   CSN: [id number] Arrival date & time: 05/07/22  1646     History  Chief Complaint  Patient presents with   Headache    Charles [name] is a 56 y.o. male.   Headache    56 year old male presenting to the emergency department with headache.  The patient states that he recently had dental surgery within the past week.  He states that 5 days ago, he developed headache starting in the occiput which now has radiated and is located primarily bitemporally.  He denies any nausea or vomiting.  He denies any light sensitivity or sound sensitivity.  He denies any fevers.  He denies any neck stiffness or rigidity.  He had an episode of brief chest pain today.  He does have a history of GERD.  At times of chest pain was sharp substernal, nonradiating and lasted around 10 minutes.  Denies any neurologic deficits.  He states that symptoms were not sudden in onset or maximal in onset.  Home Medications Prior to Admission medications   Medication Sig Start Date End Date Taking? Authorizing Provider  tamsulosin (FLOMAX) 0.4 MG CAPS capsule Take 1 capsule (0.4 mg total) by mouth daily. 05/04/22   Gwenlyn Perking, FNP  Multiple Vitamins-Minerals (MULTIVITAMIN WITH MINERALS) tablet Take 1 tablet by mouth daily.    [provider]  omeprazole (PRILOSEC) 40 MG capsule Take 1 capsule (40 mg total) by mouth 2 (two) times daily. 12/23/21   Erenest Rasher, PA-C      Allergies    Patient has no known allergies.    Review of Systems   Review of Systems  Cardiovascular:  Positive for chest pain.  Neurological:  Positive for headaches.  All other systems reviewed and are negative.   Physical Exam Updated Vital Signs BP (!) 156/108   Pulse 88   Temp 98.8 F (37.1 C) (Oral)   Resp 20   SpO2 99%  Physical Exam Vitals and nursing note reviewed.  Constitutional:      General: He is not in acute distress.    Appearance: He is  well-developed.  HENT:     Head: Normocephalic and atraumatic.  Eyes:     Conjunctiva/sclera: Conjunctivae normal.  Cardiovascular:     Rate and Rhythm: Normal rate and regular rhythm.     Heart sounds: Normal heart sounds.  Pulmonary:     Effort: Pulmonary effort is normal. No respiratory distress.     Breath sounds: Normal breath sounds.  Abdominal:     Palpations: Abdomen is soft.     Tenderness: There is no abdominal tenderness.  Musculoskeletal:        General: No swelling.     Cervical back: Neck supple.  Skin:    General: Skin is warm and dry.     Capillary Refill: Capillary refill takes less than 2 seconds.  Neurological:     Mental Status: He is alert.     GCS: GCS eye subscore is 4. GCS verbal subscore is 5. GCS motor subscore is 6.     Cranial Nerves: No cranial nerve deficit.     Sensory: No sensory deficit.     Motor: No weakness.  Psychiatric:        Mood and Affect: Mood normal.     ED Results / Procedures / Treatments   Labs (all labs ordered are listed, but only abnormal results are displayed) Labs Reviewed  CBC WITH DIFFERENTIAL/PLATELET - Abnormal; Notable for  the following components:      Result Value   RBC 4.10 (*)    Hemoglobin 12.0 (*)    HCT 36.1 (*)    Abs Immature Granulocytes 0.08 (*)    All other components within normal limits  SEDIMENTATION RATE - Abnormal; Notable for the following components:   Sed Rate 21 (*)    All other components within normal limits  BASIC METABOLIC PANEL  RAPID URINE DRUG SCREEN, HOSP PERFORMED  TROPONIN I (HIGH SENSITIVITY)    EKG None  Radiology CT Head Wo Contrast  Result Date: 05/07/2022 CLINICAL DATA:  Headache, new onset (Age >= 51y) Headache, recent oral surgery EXAM: CT HEAD WITHOUT CONTRAST TECHNIQUE: Contiguous axial images were obtained from the base of the skull through the vertex without intravenous contrast. RADIATION DOSE REDUCTION: This exam was performed according to the departmental  dose-optimization program which includes automated exposure control, adjustment of the mA and/or kV according to patient size and/or use of iterative reconstruction technique. COMPARISON:  None Available. FINDINGS: Brain: No intracranial hemorrhage, mass effect, or midline shift. No hydrocephalus. The basilar cisterns are patent. No evidence of territorial infarct or acute ischemia. No extra-axial or intracranial fluid collection. Vascular: No hyperdense vessel or unexpected calcification. Skull: No fracture or focal lesion. Sinuses/Orbits: No acute findings. The paranasal sinuses are clear. No mucosal thickening or fluid level. No mastoid effusion. Other: None. IMPRESSION: Negative noncontrast head CT. Electronically Signed   By: Keith Rake M.D.   On: 05/07/2022 22:31    Procedures Procedures    Medications Ordered in ED Medications  sodium chloride 0.9 % bolus 1,000 mL (0 mLs Intravenous Stopped 05/07/22 2245)    And  0.9 %  sodium chloride infusion ( Intravenous New Bag/Given 05/07/22 2145)  metoCLOPramide (REGLAN) injection 10 mg (10 mg Intravenous Given 05/07/22 2136)  diphenhydrAMINE (BENADRYL) injection 12.5 mg (12.5 mg Intravenous Given 05/07/22 2136)  acetaminophen (TYLENOL) tablet 1,000 mg (1,000 mg Oral Given 05/07/22 2137)  alum & mag hydroxide-simeth (MAALOX/MYLANTA) 200-200-20 MG/5ML suspension 30 mL (30 mLs Oral Given 05/07/22 2243)    And  lidocaine (XYLOCAINE) 2 % viscous mouth solution 15 mL (15 mLs Oral Given 05/07/22 2243)    ED Course/ Medical Decision Making/ A&P                           Medical Decision Making Amount and/or Complexity of Data Reviewed Labs: ordered. Radiology: ordered.  Risk OTC drugs. Prescription drug management.    56 year old male presenting to the emergency department with headache.  The patient states that he recently had dental surgery within the past week.  He states that 5 days ago, he developed headache starting in the occiput  which now has radiated and is located primarily bitemporally.  He denies any nausea or vomiting.  He denies any light sensitivity or sound sensitivity.  He denies any fevers.  He denies any neck stiffness or rigidity.  He had an episode of brief chest pain today.  He does have a history of GERD.  At times of chest pain was sharp substernal, nonradiating and lasted around 10 minutes.  Denies any neurologic deficits.  He states that symptoms were not sudden in onset or maximal in onset.  Charles [name] is a 56 y.o. male who presents with headache as per above. I have reviewed the nursing documentation for past medical history, family history, and social history. .  Currently he is awake, alert, GCS  15, HDS, and afebrile. His exam is most notable for normal gait, fully intact extraocular motions with bilaterally reactive pupils, no focal neurologic deficits, no meningismus, and no temporal tenderness. There is no rash. The headache was not sudden onset or the worst headache of the patient's life. There is no visual deficit.  I am most concerned for tension type headache. Regarding the patient's chest pain, he is low risk.  Symptoms were short in onset and sharp.  Low concern for PE, low concern for ACS.  Suspect likely GERD.  To further evaluate and risk stratify him, labs and imaging were obtained, which were significant for:  Labs: Troponin 4, UDS negative, ESR nonspecifically mildly elevated to 21, CBC without a leukocytosis, mild anemia to 12, BMP unremarkable. Imaging: CT head obtained without acute intracranial abnormality, no evidence of mass or other abnormality ECG: Sinus rhythm, ventricular rate 7 0, no acute ischemic changes, evidence for LVH present  I do not think the patient has an aneurysm, intracranial bleed, mass lesion, meningitis, temporal arteritis, stroke, cluster headache, idiopathic intracranial hypertension, cavernous sinus thrombosis, carbon monoxide toxicity, herpes zoster,  carotid or vertebral artery dissection, or acute angle close glaucoma.  On reassessment, the patient remained well appearing and was again able to ambulate without difficulty and did not have any focal neurologic deficits.  I believe the patient is stable for discharge and outpatient PCP follow-up.  We participated in shared decision making regarding continued observation in the ED versus discharge home for continued recovery after receiving the above medications. He preferred to recover at home. I believe that this is safe and reasonable.  We have discussed the diagnosis and risks, and we agree with discharging home to follow-up with their primary doctor. We also discussed returning to the Emergency Department immediately if new or worsening symptoms occur. We have discussed the symptoms which are most concerning (e.g., changing or worsening pain, weakness, vomiting, fever, or abnormal sensation) that necessitate immediate return. I provided ED return precautions. The patient felt safe with this plan.  ED Medication Summary: Medications  sodium chloride 0.9 % bolus 1,000 mL (0 mLs Intravenous Stopped 05/07/22 2245)    And  0.9 %  sodium chloride infusion ( Intravenous New Bag/Given 05/07/22 2145)  metoCLOPramide (REGLAN) injection 10 mg (10 mg Intravenous Given 05/07/22 2136)  diphenhydrAMINE (BENADRYL) injection 12.5 mg (12.5 mg Intravenous Given 05/07/22 2136)  acetaminophen (TYLENOL) tablet 1,000 mg (1,000 mg Oral Given 05/07/22 2137)  alum & mag hydroxide-simeth (MAALOX/MYLANTA) 200-200-20 MG/5ML suspension 30 mL (30 mLs Oral Given 05/07/22 2243)    And  lidocaine (XYLOCAINE) 2 % viscous mouth solution 15 mL (15 mLs Oral Given 05/07/22 2243)     Final Clinical Impression(s) / ED Diagnoses Final diagnoses:  Chest pain, unspecified type  Acute non intractable tension-type headache    Rx / DC Orders ED Discharge Orders     None         Regan Lemming, MD 05/08/22 0009

## 2022-05-07 NOTE — Discharge Instructions (Addendum)
Please follow-up with your primary care provider regarding further management.  Your imaging and laboratory testing was reassuring today. Tylenol and ibuprofen for pain control.

## 2022-05-08 ENCOUNTER — Ambulatory Visit: Payer: Managed Care, Other (non HMO) | Admitting: Gastroenterology

## 2022-05-10 ENCOUNTER — Telehealth: Payer: Self-pay

## 2022-05-10 NOTE — Telephone Encounter (Signed)
Transition Care Management Unsuccessful Follow-up Telephone Call  Date of discharge and from where:  [facility] 05/08/2022  Attempts:  1st Attempt  Reason for unsuccessful TCM follow-up call:  Left voice message

## 2022-05-11 NOTE — Telephone Encounter (Signed)
Transition Care Management Unsuccessful Follow-up Telephone Call  Date of discharge and from where:  [facility] 05/08/2022  Attempts:  2nd Attempt  Reason for unsuccessful TCM follow-up call:  No answer/busy

## 2022-05-12 ENCOUNTER — Ambulatory Visit (INDEPENDENT_AMBULATORY_CARE_PROVIDER_SITE_OTHER): Payer: Managed Care, Other (non HMO) | Admitting: Family Medicine

## 2022-05-12 ENCOUNTER — Encounter: Payer: Self-pay | Admitting: Family Medicine

## 2022-05-12 VITALS — BP 133/83 | HR 74 | Temp 97.3°F | Ht 75.0 in | Wt 188.1 lb

## 2022-05-12 DIAGNOSIS — G44209 Tension-type headache, unspecified, not intractable: Secondary | ICD-10-CM

## 2022-05-12 DIAGNOSIS — I10 Essential (primary) hypertension: Secondary | ICD-10-CM

## 2022-05-12 MED ORDER — AMLODIPINE BESYLATE 2.5 MG PO TABS: 2.5000 mg | ORAL_TABLET | Freq: Every day | ORAL | 1 refills | Status: DC

## 2022-05-12 NOTE — Patient Instructions (Signed)
Tension Headache, Adult A tension headache is a feeling of pain, pressure, or aching over the front and sides of the head. The pain can be dull, or it can feel tight. There are two types of tension headache: Episodic tension headache. This is when the headaches happen fewer than 15 days a month. Chronic tension headache. This is when the headaches happen more than 15 days a month during a 37-monthperiod. A tension headache can last from 30 minutes to several days. It is the most common kind of headache. Tension headaches are not normally associated with nausea or vomiting, and they do not get worse with physical activity. What are the causes? The exact cause of this condition is not known. Tension headaches are often triggered by stress, anxiety, or depression. Other triggers may include: Alcohol. Too much caffeine or caffeine withdrawal. Respiratory infections, such as colds, flu, or sinus infections. Dental problems or teeth clenching. Fatigue. Holding your head and neck in the same position for a long period of time, such as while using a computer. Smoking. Arthritis of the neck. What are the signs or symptoms? Symptoms of this condition include: A feeling of pressure or tightness around the head. Dull, aching head pain. Pain over the front and sides of the head. Tenderness in the muscles of the head, neck, and shoulders. How is this diagnosed? This condition may be diagnosed based on your symptoms, your medical history, and a physical exam. If your symptoms are severe or unusual, you may have imaging tests, such as a CT scan or an MRI of your head. Your vision may also be checked. How is this treated? This condition may be treated with lifestyle changes and with medicines that help relieve symptoms. Follow these instructions at home: Managing pain Take over-the-counter and prescription medicines only as told by your health care provider. When you have a headache, lie down in a dark,  quiet room. If directed, put ice on your head and neck. To do this: Put ice in a plastic bag. Place a towel between your skin and the bag. Leave the ice on for 20 minutes, 2-3 times a day. Remove the ice if your skin turns bright red. This is very important. If you cannot feel pain, heat, or cold, you have a greater risk of damage to the area. If directed, apply heat to the back of your neck as often as told by your health care provider. Use the heat source that your health care provider recommends, such as a moist heat pack or a heating pad. Place a towel between your skin and the heat source. Leave the heat on for 20-30 minutes. Remove the heat if your skin turns bright red. This is especially important if you are unable to feel pain, heat, or cold. You have a greater risk of getting burned. Eating and drinking Eat meals on a regular schedule. If you drink alcohol: Limit how much you have to: 0-1 drink a day for women who are not pregnant. 0-2 drinks a day for men. Know how much alcohol is in your drink. In the [place] one drink equals one 12 oz bottle of beer (355 mL), one 5 oz glass of wine (148 mL), or one 1 oz glass of hard liquor (44 mL). Drink enough fluid to keep your urine pale yellow. Decrease your caffeine intake, or stop using caffeine. Lifestyle Get 7-9 hours of sleep each night, or get the amount of sleep recommended by your health care provider. At bedtime,  remove computers, phones, and tablets from your room. Find ways to manage your stress. This may include: Exercise. Deep breathing exercises. Yoga. Listening to music. Positive mental imagery. Try to sit up straight and avoid tensing your muscles. Do not use any products that contain nicotine or tobacco. These include cigarettes, chewing tobacco, and vaping devices, such as e-cigarettes. If you need help quitting, ask your health care provider. General instructions  Avoid any headache triggers. Keep a journal to help  find out what may trigger your headaches. For example, write down: What you eat and drink. How much sleep you get. Any change to your diet or medicines. Keep all follow-up visits. This is important. Contact a health care provider if: Your headache does not get better. Your headache comes back. You are sensitive to sounds, light, or smells because of a headache. You have nausea or you vomit. Your stomach hurts. Get help right away if: You suddenly develop a severe headache, along with any of the following: A stiff neck. Nausea and vomiting. Confusion. Weakness in one part or one side of your body. Double vision or loss of vision. Shortness of breath. Rash. Unusual sleepiness. Fever or chills. Trouble speaking. Pain in your eye or ear. Trouble walking or balancing. Feeling faint or passing out. Summary A tension headache is a feeling of pain, pressure, or aching over the front and sides of the head. A tension headache can last from 30 minutes to several days. It is the most common kind of headache. This condition may be diagnosed based on your symptoms, your medical history, and a physical exam. This condition may be treated with lifestyle changes and with medicines that help relieve symptoms. This information is not intended to replace advice given to you by your health care provider. Make sure you discuss any questions you have with your health care provider. Document Revised: 02/12/2020 Document Reviewed: 02/12/2020 Elsevier Patient Education  [employer].

## 2022-05-12 NOTE — Telephone Encounter (Signed)
Transition Care Management Unsuccessful Follow-up Telephone Call  Date of discharge and from where:  [facility] 05/08/2022  Attempts:  3rd Attempt  Reason for unsuccessful TCM follow-up call:  Unable to reach patient

## 2022-05-12 NOTE — Progress Notes (Unsigned)
   Established Patient Office Visit  Subjective   Patient ID: Charles [name], male    DOB: 1965/09/23  Age: 56 y.o. MRN: [id number]  Chief Complaint  Patient presents with   Hypertension   Headache    HPI Charles [name] is here with his significant other today. She   Reports headahce has been coming and going for 1 week. Bitemporal. He sometimes has nasuea with it. Deneis vomiting, sensitive to light or noise, changes in vision,  Excedrin tension has been helping.   Home BP readings have been 150/110, 140s/90. He has been checking it after resting.      {History (Optional):23778}  ROS    Objective:     BP 133/83   Pulse 74   Temp (!) 97.3 F (36.3 C) (Temporal)   Ht '6\' 3"'$  (1.905 m)   Wt 188 lb 2 oz (85.3 kg)   SpO2 99%   BMI 23.51 kg/m  BP Readings from Last 3 Encounters:  05/12/22 133/83  05/07/22 (!) 156/108  05/03/22 (!) 149/91      Physical Exam   No results found for any visits on 05/12/22.  {Labs (Optional):23779}  The 10-year ASCVD risk score (Arnett DK, et al., 2019) is: 7.8%    Assessment & Plan:   Problem List Items Addressed This Visit   None   No follow-ups on file.    Gwenlyn Perking, FNP

## 2022-07-25 ENCOUNTER — Encounter: Payer: Self-pay | Admitting: Family Medicine

## 2022-07-26 ENCOUNTER — Other Ambulatory Visit: Payer: Self-pay | Admitting: Family Medicine

## 2022-07-26 DIAGNOSIS — R399 Unspecified symptoms and signs involving the genitourinary system: Secondary | ICD-10-CM

## 2022-11-06 ENCOUNTER — Encounter: Payer: Self-pay | Admitting: Family Medicine

## 2022-11-17 ENCOUNTER — Other Ambulatory Visit: Payer: Self-pay | Admitting: Family Medicine

## 2022-11-17 DIAGNOSIS — I10 Essential (primary) hypertension: Secondary | ICD-10-CM

## 2022-11-26 ENCOUNTER — Other Ambulatory Visit: Payer: Self-pay | Admitting: Family Medicine

## 2022-11-26 DIAGNOSIS — I10 Essential (primary) hypertension: Secondary | ICD-10-CM

## 2022-11-27 MED ORDER — AMLODIPINE BESYLATE 2.5 MG PO TABS: 2.5000 mg | ORAL_TABLET | Freq: Every day | ORAL | 0 refills | Status: DC

## 2022-12-19 ENCOUNTER — Other Ambulatory Visit: Payer: Self-pay | Admitting: Family Medicine

## 2022-12-19 DIAGNOSIS — I10 Essential (primary) hypertension: Secondary | ICD-10-CM

## 2023-01-13 ENCOUNTER — Other Ambulatory Visit: Payer: Self-pay | Admitting: Family Medicine

## 2023-01-13 DIAGNOSIS — I10 Essential (primary) hypertension: Secondary | ICD-10-CM

## 2023-09-27 ENCOUNTER — Encounter: Payer: Self-pay | Admitting: Family Medicine

## 2023-09-28 ENCOUNTER — Ambulatory Visit: Payer: Self-pay | Admitting: *Deleted

## 2023-09-28 NOTE — Telephone Encounter (Signed)
  Chief Complaint: elevated BP yesterday with sx, ran out of BP 12/2022 Symptoms: reports no sx today but has not checked BP today . Yesterday BP 155/110. Sx headache, right arm dull pain and was working in warehouse in heat. Reports sx on and off for the past 2 weeks . Frequency: yesterday  Pertinent Negatives: Patient denies chest pain no difficulty breathing no headache today , no weakness on either side of body. No dizziness no blurred vision no slurred speech  Disposition: [] ED /[] Urgent Care (no appt availability in office) / [x] Appointment(In office/virtual)/ []  [facility]/ [] Home Care/ [] Refused Recommended Disposition /[] [facility]/ []  Follow-up with PCP Additional Notes:   No available OV today for medication refills and evaluation. Patient at work and reports he can not leave. Called [facility] for assistance with scheduling appt . None available with PCP until 10/05/23.  Instructed patient to go to ED if sx returned , stay hydrated, monitor intake of salty foods and if feeling overheated cool off with cool compress. Appt able to be scheduled for Monday 10/01/23. Patient requesting work note for appt on Monday .     Copied from CRM [id number]. Topic: Clinical - Red Word Triage >> Sep 28, 2023  9:35 AM Carrielelia G wrote: Red Word that prompted transfer to Nurse Triage: 155/110 bp , head pain Reason for Disposition  Ran out of BP medications  Answer Assessment - Initial Assessment Questions 1. BLOOD PRESSURE: "What is the blood pressure?" "Did you take at least two measurements 5 minutes apart?"     BP 155/110 yesterday  2. ONSET: "When did you take your blood pressure?"     yesterday 3. HOW: "How did you take your blood pressure?" (e.g., automatic home BP monitor, visiting nurse)     No sure 4. HISTORY: "Do you have a history of high blood pressure?"     Yes  5. MEDICINES: "Are you taking any medicines for blood pressure?" "Have you missed any doses recently?"     Has  not been taking meds since 12/2022. Ran out and needed OV  6. OTHER SYMPTOMS: "Do you have any symptoms?" (e.g., blurred vision, chest pain, difficulty breathing, headache, weakness)     No sx now , yesterday  headache, right arm dull pain 7. PREGNANCY: "Is there any chance you are pregnant?" "When was your last menstrual period?"     na  Protocols used: Blood Pressure - High-A-AH

## 2023-09-28 NOTE — Telephone Encounter (Signed)
 Spoke with patient and got him scheduled with [facility] on Monday at 8am. Pt says he feels better today than he did yesterday when his BP was 155/110. Advised pt to keep a check of his BP at home until his appt on Monday and avoid eating bad foods and drink plenty of water  and if his BP gets elevated again or if he starts feeling bad again, then he needs to go to ER. Pt voiced understanding.

## 2023-10-01 ENCOUNTER — Ambulatory Visit (INDEPENDENT_AMBULATORY_CARE_PROVIDER_SITE_OTHER): Admitting: Nurse Practitioner

## 2023-10-01 ENCOUNTER — Other Ambulatory Visit: Payer: Self-pay

## 2023-10-01 ENCOUNTER — Encounter: Payer: Self-pay | Admitting: Nurse Practitioner

## 2023-10-01 ENCOUNTER — Telehealth: Payer: Self-pay | Admitting: Family Medicine

## 2023-10-01 VITALS — BP 136/83 | HR 93 | Temp 98.1°F | Ht 75.0 in | Wt 193.0 lb

## 2023-10-01 DIAGNOSIS — I10 Essential (primary) hypertension: Secondary | ICD-10-CM | POA: Diagnosis not present

## 2023-10-01 LAB — BMP8+EGFR
BUN/Creatinine Ratio: 13 (ref 9–20)
BUN: 13 mg/dL (ref 6–24)
CO2: 25 mmol/L (ref 20–29)
Calcium: 9.6 mg/dL (ref 8.7–10.2)
Chloride: 101 mmol/L (ref 96–106)
Creatinine, Ser: 1.03 mg/dL (ref 0.76–1.27)
Glucose: 85 mg/dL (ref 70–99)
Potassium: 4.5 mmol/L (ref 3.5–5.2)
Sodium: 140 mmol/L (ref 134–144)
eGFR: 86 mL/min/{1.73_m2} (ref >59)

## 2023-10-01 MED ORDER — AMLODIPINE BESYLATE 2.5 MG PO TABS: 2.5000 mg | ORAL_TABLET | Freq: Every day | ORAL | 1 refills | Status: DC

## 2023-10-01 NOTE — Progress Notes (Signed)
   Subjective:    Patient ID: Charles [name], male    DOB: July 31, 1965, 58 y.o.   MRN: [id number]   Chief Complaint: BP high (Bp at home 147/107)   HPI  Patient come sin today c/o hypertension. He was  on amlodipine . He stopped taking it  over a year ago. Says he has been feeling fine. Started checking his blood pressure a few weeks and blood pressure has been running 140-160 systolic.   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  Eyes:  Negative for pain.  Respiratory:  Negative for shortness of breath.   Cardiovascular:  Positive for chest pain (intermittent). Negative for palpitations and leg swelling.  Gastrointestinal:  Negative for abdominal pain.  Endocrine: Negative for polydipsia.  Skin:  Negative for rash.  Neurological:  Positive for headaches. Negative for dizziness and weakness.  Hematological:  Does not bruise/bleed easily.  All other systems reviewed and are negative.      Objective:   Physical Exam Constitutional:      Appearance: Normal appearance.  Cardiovascular:     Rate and Rhythm: Normal rate and regular rhythm.     Heart sounds: Normal heart sounds.  Pulmonary:     Effort: Pulmonary effort is normal.     Breath sounds: Normal breath sounds.  Skin:    General: Skin is warm.  Neurological:     General: No focal deficit present.     Mental Status: He is alert and oriented to person, place, and time.  Psychiatric:        Mood and Affect: Mood normal.        Behavior: Behavior normal.     BP 136/83   Pulse 93   Temp 98.1 F (36.7 C) (Temporal)   Ht 6\' 3"  (1.905 m)   Wt 193 lb (87.5 kg)   SpO2  98%   BMI 24.12 kg/m   EKG- NSR- Mary-Margaret Gaylyn Keas, FNP      Assessment & Plan:  Duffy Gianotti in today with chief complaint of BP high (Bp at home 147/107)   1. Primary hypertension (Primary) Low salt diet Keep appt for physical next month with PCP - EKG 12-Lead - amLODipine  (NORVASC ) 2.5 MG tablet; Take 1 tablet (2.5 mg total) by mouth daily. (NEEDS TO BE SEEN BEFORE NEXT REFILL)  Dispense: 90 tablet; Refill: 1    The above assessment and management plan was discussed with the patient. The patient verbalized understanding of and has agreed to the management plan. Patient is aware to call the clinic if symptoms persist or worsen. Patient is aware when to return to the clinic for a follow-up visit. Patient educated on when it is appropriate to go to the emergency department.   Mary-Margaret Gaylyn Keas, FNP

## 2023-10-01 NOTE — Telephone Encounter (Signed)
 Sent!

## 2023-10-01 NOTE — Telephone Encounter (Signed)
 Needs Rx's transferred to [facility] on [address]. Says they were sent to wrong pharmacy this morning.

## 2023-10-01 NOTE — Patient Instructions (Signed)

## 2023-11-23 ENCOUNTER — Encounter: Payer: Self-pay | Admitting: Family Medicine

## 2023-11-23 ENCOUNTER — Ambulatory Visit (INDEPENDENT_AMBULATORY_CARE_PROVIDER_SITE_OTHER): Admitting: Family Medicine

## 2023-11-23 VITALS — BP 160/85 | HR 92 | Temp 98.0°F | Ht 75.0 in | Wt 197.0 lb

## 2023-11-23 DIAGNOSIS — Z0001 Encounter for general adult medical examination with abnormal findings: Secondary | ICD-10-CM | POA: Diagnosis not present

## 2023-11-23 DIAGNOSIS — Z0000 Encounter for general adult medical examination without abnormal findings: Secondary | ICD-10-CM

## 2023-11-23 DIAGNOSIS — E782 Mixed hyperlipidemia: Secondary | ICD-10-CM

## 2023-11-23 DIAGNOSIS — Z1211 Encounter for screening for malignant neoplasm of colon: Secondary | ICD-10-CM | POA: Diagnosis not present

## 2023-11-23 DIAGNOSIS — I10 Essential (primary) hypertension: Secondary | ICD-10-CM | POA: Diagnosis not present

## 2023-11-23 MED ORDER — AMLODIPINE BESYLATE 5 MG PO TABS: 5.0000 mg | ORAL_TABLET | Freq: Every day | ORAL | 3 refills | Status: AC

## 2023-11-23 NOTE — Progress Notes (Signed)
 Complete physical exam  Patient: Charles [name]   DOB: [birth year]   58 y.o. Male  MRN: [id number]  Subjective:    Chief Complaint  Patient presents with   Annual Exam    Charles [name] is a 58 y.o. male who presents today for a complete physical exam. He reports consuming a general diet. The patient has a physically strenuous job, but has no regular exercise apart from work.  He generally feels fairly well. He reports sleeping well. He does not have additional problems to discuss today.   He has been compliant with amlodipine  2.5 mg daily. Has been taking this for about 6 weeks. Denies chest pain, shortness of breath, edema, dizziness.   Most recent fall risk assessment:    11/23/2023    3:29 PM  Fall Risk   Falls in the past year? 0     Most recent depression screenings:    11/23/2023    3:29 PM 10/01/2023    7:58 AM  PHQ 2/9 Scores  PHQ - 2 Score 0 0  PHQ- 9 Score 0         Patient Care Team: Joesph Annabella HERO, FNP as PCP - General (Family Medicine) Harvey Margo CROME, MD (Inactive) (Gastroenterology)   Outpatient Medications Prior to Visit  Medication Sig   amLODipine  (NORVASC ) 2.5 MG tablet Take 1 tablet (2.5 mg total) by mouth daily. (NEEDS TO BE SEEN BEFORE NEXT REFILL)   Multiple Vitamins-Minerals (MULTIVITAMIN WITH MINERALS) tablet Take 1 tablet by mouth daily.   omeprazole  (PRILOSEC) 40 MG capsule Take 1 capsule (40 mg total) by mouth 2 (two) times daily.   [DISCONTINUED] tamsulosin  (FLOMAX ) 0.4 MG CAPS capsule Take 1 capsule (0.4 mg total) by mouth daily.   No facility-administered medications prior to visit.    ROS Negative unless specially indicated above in HPI.     Objective:     BP (!) 160/85   Pulse 92   Temp 98 F (36.7 C) (Temporal)   Ht 6' 3 (1.905 m)   Wt 197 lb (89.4 kg)   SpO2 96%   BMI 24.62 kg/m    Physical Exam Vitals and nursing note reviewed.  Constitutional:      General: He is not in acute distress.    Appearance:  Normal appearance. He is not ill-appearing, toxic-appearing or diaphoretic.  HENT:     Head: Normocephalic.     Right Ear: Tympanic membrane, ear canal and external ear normal.     Left Ear: Tympanic membrane, ear canal and external ear normal.     Nose: Nose normal.     Mouth/Throat:     Mouth: Mucous membranes are moist.     Pharynx: Oropharynx is clear.   Eyes:     Extraocular Movements: Extraocular movements intact.     Conjunctiva/sclera: Conjunctivae normal.     Pupils: Pupils are equal, round, and reactive to light.   Neck:     Thyroid : No thyroid  mass, thyromegaly or thyroid  tenderness.   Cardiovascular:     Rate and Rhythm: Normal rate and regular rhythm.     Pulses: Normal pulses.     Heart sounds: Normal heart sounds. No murmur heard.    No friction rub. No gallop.  Pulmonary:     Effort: Pulmonary effort is normal.     Breath sounds: Normal breath sounds.  Abdominal:     General: Bowel sounds are normal. There is no distension.     Palpations: Abdomen is soft.  There is no mass.     Tenderness: There is no abdominal tenderness. There is no guarding.   Musculoskeletal:     Cervical back: Normal range of motion and neck supple. No tenderness.     Right lower leg: No edema.     Left lower leg: No edema.   Skin:    General: Skin is warm and dry.     Capillary Refill: Capillary refill takes less than 2 seconds.     Findings: No lesion or rash.   Neurological:     General: No focal deficit present.     Mental Status: He is alert and oriented to person, place, and time.   Psychiatric:        Mood and Affect: Mood normal.        Behavior: Behavior normal.        Thought Content: Thought content normal.      No results found for any visits on 11/23/23.     Assessment & Plan:    Routine Health Maintenance and Physical Exam  Charles [name] was seen today for annual exam.  Diagnoses and all orders for this visit:  Routine general medical examination at a  health care facility  Primary hypertension BP not at goal. Increase amlodipine  to 5 mg daily. Will recheck in 4 weeks. He will return for fasting labs.  -     CBC with Differential/Platelet; Future -     CMP14+EGFR; Future -     TSH; Future -     amLODipine  (NORVASC ) 5 MG tablet; Take 1 tablet (5 mg total) by mouth daily.  Mixed hyperlipidemia -     Lipid panel; Future  Colon cancer screening -     Ambulatory referral to Gastroenterology    Immunization History  Administered Date(s) Administered   Tdap 02/24/2021    Health Maintenance  Topic Date Due   Hepatitis B Vaccines (1 of 3 - 19+ 3-dose series) Never done   Colonoscopy  03/05/2022   COVID-19 Vaccine (1 - 2024-25 season) 12/08/2024 (Originally 01/28/2023)   Zoster Vaccines- Shingrix (1 of 2) 02/22/2025 (Originally 12/10/2017)   INFLUENZA VACCINE  12/28/2023   DTaP/Tdap/Td (2 - Td or Tdap) 02/25/2031   Hepatitis C Screening  Completed   HIV Screening  Completed   HPV VACCINES  Aged Out   Meningococcal B Vaccine  Aged Out    Discussed health benefits of physical activity, and encouraged him to engage in regular exercise appropriate for his age and condition.  Problem List Items Addressed This Visit   None Visit Diagnoses       Routine general medical examination at a health care facility    -  Primary     Primary hypertension       Relevant Medications   amLODipine  (NORVASC ) 5 MG tablet   Other Relevant Orders   CBC with Differential/Platelet   CMP14+EGFR   TSH     Mixed hyperlipidemia       Relevant Medications   amLODipine  (NORVASC ) 5 MG tablet   Other Relevant Orders   Lipid panel     Colon cancer screening       Relevant Orders   Ambulatory referral to Gastroenterology      Return in about 4 weeks (around 12/21/2023) for HTN.   The patient indicates understanding of these issues and agrees with the plan.  Annabella CHRISTELLA Search, FNP

## 2023-11-23 NOTE — Patient Instructions (Addendum)
Health Maintenance, Male Adopting a healthy lifestyle and getting preventive care are important in promoting health and wellness. Ask your health care provider about: The right schedule for you to have regular tests and exams. Things you can do on your own to prevent diseases and keep yourself healthy. What should I know about diet, weight, and exercise? Eat a healthy diet  Eat a diet that includes plenty of vegetables, fruits, low-fat dairy products, and lean protein. Do not eat a lot of foods that are high in solid fats, added sugars, or sodium. Maintain a healthy weight Body mass index (BMI) is a measurement that can be used to identify possible weight problems. It estimates body fat based on height and weight. Your health care provider can help determine your BMI and help you achieve or maintain a healthy weight. Get regular exercise Get regular exercise. This is one of the most important things you can do for your health. Most adults should: Exercise for at least 150 minutes each week. The exercise should increase your heart rate and make you sweat (moderate-intensity exercise). Do strengthening exercises at least twice a week. This is in addition to the moderate-intensity exercise. Spend less time sitting. Even light physical activity can be beneficial. Watch cholesterol and blood lipids Have your blood tested for lipids and cholesterol at 56 years of age, then have this test every 5 years. You may need to have your cholesterol levels checked more often if: Your lipid or cholesterol levels are high. You are older than 56 years of age. You are at high risk for heart disease. What should I know about cancer screening? Many types of cancers can be detected early and may often be prevented. Depending on your health history and family history, you may need to have cancer screening at various ages. This may include screening for: Colorectal cancer. Prostate cancer. Skin cancer. Lung  cancer. What should I know about heart disease, diabetes, and high blood pressure? Blood pressure and heart disease High blood pressure causes heart disease and increases the risk of stroke. This is more likely to develop in people who have high blood pressure readings or are overweight. Talk with your health care provider about your target blood pressure readings. Have your blood pressure checked: Every 3-5 years if you are 85-68 years of age. Every year if you are 1 years old or older. If you are between the ages of 30 and 36 and are a current or former smoker, ask your health care provider if you should have a one-time screening for abdominal aortic aneurysm (AAA). Diabetes Have regular diabetes screenings. This checks your fasting blood sugar level. Have the screening done: Once every three years after age 58 if you are at a normal weight and have a low risk for diabetes. More often and at a younger age if you are overweight or have a high risk for diabetes. What should I know about preventing infection? Hepatitis B If you have a higher risk for hepatitis B, you should be screened for this virus. Talk with your health care provider to find out if you are at risk for hepatitis B infection. Hepatitis C Blood testing is recommended for: Everyone born from [birth year] through 1963. Anyone with known risk factors for hepatitis C. Sexually transmitted infections (STIs) You should be screened each year for STIs, including gonorrhea and chlamydia, if: You are sexually active and are younger than 56 years of age. You are older than 56 years of age and your  health care provider tells you that you are at risk for this type of infection. Your sexual activity has changed since you were last screened, and you are at increased risk for chlamydia or gonorrhea. Ask your health care provider if you are at risk. Ask your health care provider about whether you are at high risk for HIV. Your health care provider  may recommend a prescription medicine to help prevent HIV infection. If you choose to take medicine to prevent HIV, you should first get tested for HIV. You should then be tested every 3 months for as long as you are taking the medicine. Follow these instructions at home: Alcohol use Do not drink alcohol if your health care provider tells you not to drink. If you drink alcohol: Limit how much you have to 0-2 drinks a day. Know how much alcohol is in your drink. In the [place] one drink equals one 12 oz bottle of beer (355 mL), one 5 oz glass of wine (148 mL), or one 1 oz glass of hard liquor (44 mL). Lifestyle Do not use any products that contain nicotine or tobacco. These products include cigarettes, chewing tobacco, and vaping devices, such as e-cigarettes. If you need help quitting, ask your health care provider. Do not use street drugs. Do not share needles. Ask your health care provider for help if you need support or information about quitting drugs. General instructions Schedule regular health, dental, and eye exams. Stay current with your vaccines. Tell your health care provider if: You often feel depressed. You have ever been abused or do not feel safe at home. Summary Adopting a healthy lifestyle and getting preventive care are important in promoting health and wellness. Follow your health care provider's instructions about healthy diet, exercising, and getting tested or screened for diseases. Follow your health care provider's instructions on monitoring your cholesterol and blood pressure. This information is not intended to replace advice given to you by your health care provider. Make sure you discuss any questions you have with your health care provider. Document Revised: 10/04/2020 Document Reviewed: 10/04/2020 Elsevier Patient Education  2024 [employer]  Health Maintenance, Male Adopting a healthy lifestyle and getting preventive care are important in promoting health  and wellness. Ask your health care provider about: The right schedule for you to have regular tests and exams. Things you can do on your own to prevent diseases and keep yourself healthy. What should I know about diet, weight, and exercise? Eat a healthy diet  Eat a diet that includes plenty of vegetables, fruits, low-fat dairy products, and lean protein. Do not eat a lot of foods that are high in solid fats, added sugars, or sodium. Maintain a healthy weight Body mass index (BMI) is a measurement that can be used to identify possible weight problems. It estimates body fat based on height and weight. Your health care provider can help determine your BMI and help you achieve or maintain a healthy weight. Get regular exercise Get regular exercise. This is one of the most important things you can do for your health. Most adults should: Exercise for at least 150 minutes each week. The exercise should increase your heart rate and make you sweat (moderate-intensity exercise). Do strengthening exercises at least twice a week. This is in addition to the moderate-intensity exercise. Spend less time sitting. Even light physical activity can be beneficial. Watch cholesterol and blood lipids Have your blood tested for lipids and cholesterol at 56 years of age, then have this  test every 5 years. You may need to have your cholesterol levels checked more often if: Your lipid or cholesterol levels are high. You are older than 56 years of age. You are at high risk for heart disease. What should I know about cancer screening? Many types of cancers can be detected early and may often be prevented. Depending on your health history and family history, you may need to have cancer screening at various ages. This may include screening for: Colorectal cancer. Prostate cancer. Skin cancer. Lung cancer. What should I know about heart disease, diabetes, and high blood pressure? Blood pressure and heart disease High  blood pressure causes heart disease and increases the risk of stroke. This is more likely to develop in people who have high blood pressure readings or are overweight. Talk with your health care provider about your target blood pressure readings. Have your blood pressure checked: Every 3-5 years if you are 40-5 years of age. Every year if you are 64 years old or older. If you are between the ages of 45 and 4 and are a current or former smoker, ask your health care provider if you should have a one-time screening for abdominal aortic aneurysm (AAA). Diabetes Have regular diabetes screenings. This checks your fasting blood sugar level. Have the screening done: Once every three years after age 13 if you are at a normal weight and have a low risk for diabetes. More often and at a younger age if you are overweight or have a high risk for diabetes. What should I know about preventing infection? Hepatitis B If you have a higher risk for hepatitis B, you should be screened for this virus. Talk with your health care provider to find out if you are at risk for hepatitis B infection. Hepatitis C Blood testing is recommended for: Everyone born from [birth year] through 1965. Anyone with known risk factors for hepatitis C. Sexually transmitted infections (STIs) You should be screened each year for STIs, including gonorrhea and chlamydia, if: You are sexually active and are younger than 56 years of age. You are older than 56 years of age and your health care provider tells you that you are at risk for this type of infection. Your sexual activity has changed since you were last screened, and you are at increased risk for chlamydia or gonorrhea. Ask your health care provider if you are at risk. Ask your health care provider about whether you are at high risk for HIV. Your health care provider may recommend a prescription medicine to help prevent HIV infection. If you choose to take medicine to prevent HIV, you  should first get tested for HIV. You should then be tested every 3 months for as long as you are taking the medicine. Follow these instructions at home: Alcohol use Do not drink alcohol if your health care provider tells you not to drink. If you drink alcohol: Limit how much you have to 0-2 drinks a day. Know how much alcohol is in your drink. In the [place] one drink equals one 12 oz bottle of beer (355 mL), one 5 oz glass of wine (148 mL), or one 1 oz glass of hard liquor (44 mL). Lifestyle Do not use any products that contain nicotine or tobacco. These products include cigarettes, chewing tobacco, and vaping devices, such as e-cigarettes. If you need help quitting, ask your health care provider. Do not use street drugs. Do not share needles. Ask your health care provider for help if you need  support or information about quitting drugs. General instructions Schedule regular health, dental, and eye exams. Stay current with your vaccines. Tell your health care provider if: You often feel depressed. You have ever been abused or do not feel safe at home. Summary Adopting a healthy lifestyle and getting preventive care are important in promoting health and wellness. Follow your health care provider's instructions about healthy diet, exercising, and getting tested or screened for diseases. Follow your health care provider's instructions on monitoring your cholesterol and blood pressure. This information is not intended to replace advice given to you by your health care provider. Make sure you discuss any questions you have with your health care provider. Document Revised: 10/04/2020 Document Reviewed: 10/04/2020 Elsevier Patient Education  2024 [employer].

## 2023-12-14 ENCOUNTER — Encounter: Payer: Self-pay | Admitting: Family Medicine

## 2023-12-21 ENCOUNTER — Ambulatory Visit (INDEPENDENT_AMBULATORY_CARE_PROVIDER_SITE_OTHER): Admitting: Family Medicine

## 2023-12-21 ENCOUNTER — Encounter: Payer: Self-pay | Admitting: Family Medicine

## 2023-12-21 VITALS — BP 130/80 | HR 80 | Temp 97.7°F | Ht 75.0 in | Wt 198.4 lb

## 2023-12-21 DIAGNOSIS — I10 Essential (primary) hypertension: Secondary | ICD-10-CM

## 2023-12-21 NOTE — Progress Notes (Signed)
   Acute Office Visit  Subjective:     Patient ID: Charles [name], male    DOB: 07/01/1965, 58 y.o.   MRN: [id number]  Chief Complaint  Patient presents with   Medical Management of Chronic Issues    HPI Patient is in today for BP follow up.   HTN Complaint with meds - Yes Current Medications - amlodipine  5 mg Pertinent ROS:  Visual Disturbances - No Chest pain - No Dyspnea - No Palpitations - No LE edema - No  ROS As per HPI.     Objective:    BP 130/80   Pulse 80   Temp 97.7 F (36.5 C) (Temporal)   Ht 6' 3 (1.905 m)   Wt 198 lb 6.4 oz (90 kg)   SpO2 96%   BMI 24.80 kg/m    Physical Exam Vitals and nursing note reviewed.  Constitutional:      General: He is not in acute distress.    Appearance: Normal appearance. He is not ill-appearing, toxic-appearing or diaphoretic.  Cardiovascular:     Rate and Rhythm: Normal rate and regular rhythm.     Heart sounds: Normal heart sounds. No murmur heard. Pulmonary:     Effort: Pulmonary effort is normal. No respiratory distress.     Breath sounds: Normal breath sounds. No wheezing.  Musculoskeletal:     Right lower leg: No edema.     Left lower leg: No edema.  Skin:    General: Skin is warm and dry.  Neurological:     General: No focal deficit present.     Mental Status: He is alert and oriented to person, place, and time.  Psychiatric:        Mood and Affect: Mood normal.        Behavior: Behavior normal.     No results found for any visits on 12/21/23.      Assessment & Plan:   Dajohn Ellender was seen today for medical management of chronic issues.  Diagnoses and all orders for this visit:  Primary hypertension Well controlled on current regimen. Labs pending.  -     CBC with Differential/Platelet -     CMP14+EGFR -     Lipid panel -     TSH  Return in about 3 months (around 03/22/2024) for chronic follow up.  The patient indicates understanding of these issues and agrees with the  plan.  Annabella CHRISTELLA Search, FNP

## 2023-12-22 LAB — CBC WITH DIFFERENTIAL/PLATELET
Basophils Absolute: 0 x10E3/uL (ref 0.0–0.2)
Basos: 1 %
EOS (ABSOLUTE): 0.1 x10E3/uL (ref 0.0–0.4)
Eos: 3 %
Hematocrit: 37.2 % — ABNORMAL LOW (ref 37.5–51.0)
Hemoglobin: 12.3 g/dL — ABNORMAL LOW (ref 13.0–17.7)
Immature Grans (Abs): 0 x10E3/uL (ref 0.0–0.1)
Immature Granulocytes: 0 %
Lymphocytes Absolute: 1.3 x10E3/uL (ref 0.7–3.1)
Lymphs: 31 %
MCH: 29.9 pg (ref 26.6–33.0)
MCHC: 33.1 g/dL (ref 31.5–35.7)
MCV: 90 fL (ref 79–97)
Monocytes Absolute: 0.4 x10E3/uL (ref 0.1–0.9)
Monocytes: 10 %
Neutrophils Absolute: 2.2 x10E3/uL (ref 1.4–7.0)
Neutrophils: 54 %
Platelets: 207 x10E3/uL (ref 150–450)
RBC: 4.12 x10E6/uL — ABNORMAL LOW (ref 4.14–5.80)
RDW: 12.7 % (ref 11.6–15.4)
WBC: 4.1 x10E3/uL (ref 3.4–10.8)

## 2023-12-22 LAB — CMP14+EGFR
ALT: 24 IU/L (ref 0–44)
AST: 31 IU/L (ref 0–40)
Albumin: 4.2 g/dL (ref 3.8–4.9)
Alkaline Phosphatase: 87 IU/L (ref 44–121)
BUN/Creatinine Ratio: 8 — ABNORMAL LOW (ref 9–20)
BUN: 9 mg/dL (ref 6–24)
Bilirubin Total: 0.3 mg/dL (ref 0.0–1.2)
CO2: 25 mmol/L (ref 20–29)
Calcium: 9.1 mg/dL (ref 8.7–10.2)
Chloride: 100 mmol/L (ref 96–106)
Creatinine, Ser: 1.08 mg/dL (ref 0.76–1.27)
Globulin, Total: 2.6 g/dL (ref 1.5–4.5)
Glucose: 101 mg/dL — ABNORMAL HIGH (ref 70–99)
Potassium: 3.6 mmol/L (ref 3.5–5.2)
Sodium: 139 mmol/L (ref 134–144)
Total Protein: 6.8 g/dL (ref 6.0–8.5)
eGFR: 81 mL/min/1.73 (ref >59)

## 2023-12-22 LAB — TSH: TSH: 0.556 u[IU]/mL (ref 0.450–4.500)

## 2023-12-22 LAB — LIPID PANEL
Chol/HDL Ratio: 6.5 ratio — ABNORMAL HIGH (ref 0.0–5.0)
Cholesterol, Total: 157 mg/dL (ref 100–199)
HDL: 24 mg/dL — ABNORMAL LOW (ref >39)
LDL Chol Calc (NIH): 62 mg/dL (ref 0–99)
Triglycerides: 459 mg/dL — ABNORMAL HIGH (ref 0–149)
VLDL Cholesterol Cal: 71 mg/dL — ABNORMAL HIGH (ref 5–40)

## 2023-12-24 ENCOUNTER — Ambulatory Visit: Payer: Self-pay | Admitting: Family Medicine

## 2023-12-25 ENCOUNTER — Encounter (INDEPENDENT_AMBULATORY_CARE_PROVIDER_SITE_OTHER): Payer: Self-pay | Admitting: *Deleted

## 2023-12-26 LAB — SPECIMEN STATUS REPORT

## 2023-12-26 LAB — IRON: Iron: 92 ug/dL (ref 38–169)

## 2024-01-08 ENCOUNTER — Telehealth: Payer: Self-pay

## 2024-01-08 NOTE — Telephone Encounter (Signed)
 Who is your primary care physician: Annabella Search NP  Reasons for the colonoscopy: Hx polyps  Have you had a colonoscopy before?  yes  Do you have family history of colon cancer? no  Previous colonoscopy with polyps removed? no  Do you have a history colorectal cancer?   no  Are you diabetic? If yes, Type 1 or Type 2?    no  Do you have a prosthetic or mechanical heart valve? no  Do you have a pacemaker/defibrillator?   no  Have you had endocarditis/atrial fibrillation? no  Have you had joint replacement within the last 12 months?  no  Do you tend to be constipated or have to use laxatives? no  Do you have any history of drugs or alchohol?  no  Do you use supplemental oxygen?  no  Have you had a stroke or heart attack within the last 6 months? no  Do you take weight loss medication?  no    Do you take any blood-thinning medications such as: (aspirin, warfarin, Plavix, Aggrenox)  yes  If yes we need the name, milligram, dosage and who is prescribing doctor ASA 81 mg Tiffany Morgan Current Outpatient Medications on File Prior to Visit  Medication Sig Dispense Refill   amLODipine  (NORVASC ) 5 MG tablet Take 1 tablet (5 mg total) by mouth daily. 90 tablet 3   Melaton-Elderb-Vit C-Vit D3-Zn (SLEEP + IMMUNE HEALTH GUMMIES PO) Take by mouth.     Multiple Vitamins-Minerals (MULTIVITAMIN WITH MINERALS) tablet Take 1 tablet by mouth daily.     omeprazole  (PRILOSEC) 40 MG capsule Take 1 capsule (40 mg total) by mouth 2 (two) times daily. 60 capsule 3   No current facility-administered medications on file prior to visit.    No Known Allergies   Pharmacy: [facility]  Primary Insurance Name: Sherleen [id number] O[employer]  Best number where you can be reached: [phone]

## 2024-02-06 NOTE — Telephone Encounter (Signed)
ASA 2.  ?

## 2024-02-07 NOTE — Telephone Encounter (Signed)
LMOVM to call back to schedule 

## 2024-02-08 NOTE — Telephone Encounter (Signed)
 LMOVM to return call  Pt left message returning call

## 2024-02-13 ENCOUNTER — Encounter: Payer: Self-pay | Admitting: *Deleted

## 2024-02-13 ENCOUNTER — Other Ambulatory Visit: Payer: Self-pay | Admitting: *Deleted

## 2024-02-13 ENCOUNTER — Encounter (INDEPENDENT_AMBULATORY_CARE_PROVIDER_SITE_OTHER): Payer: Self-pay | Admitting: *Deleted

## 2024-02-13 MED ORDER — PEG 3350-KCL-NA BICARB-NACL 420 G PO SOLR: 4000.0000 mL | Freq: Once | ORAL | 0 refills | Status: AC

## 2024-02-13 NOTE — Telephone Encounter (Signed)
 Referral completed, TCS apt letter sent to PCP

## 2024-02-13 NOTE — Telephone Encounter (Signed)
 Pt has been scheduled with Dr.Carver for 03/04/24. Instructions mailed and prep sent to pharmacy

## 2024-02-27 ENCOUNTER — Telehealth: Payer: Self-pay

## 2024-02-27 NOTE — Telephone Encounter (Signed)
 Returned the pt's fiance's call Sales executive) regarding pt being on Cipro for 7 to 10 days. Vm stated she wanted us  to know because the pt having a colonoscopy

## 2024-03-04 ENCOUNTER — Encounter (HOSPITAL_COMMUNITY)
Admission: RE | Disposition: A | Discharge status: Home / Self Care | Payer: Self-pay | Source: Home / Self Care | Attending: Gastroenterology

## 2024-03-04 ENCOUNTER — Other Ambulatory Visit: Payer: Self-pay

## 2024-03-04 ENCOUNTER — Ambulatory Visit (HOSPITAL_COMMUNITY): Admitting: Certified Registered Nurse Anesthetist

## 2024-03-04 ENCOUNTER — Encounter (HOSPITAL_COMMUNITY): Payer: Self-pay | Admitting: Gastroenterology

## 2024-03-04 ENCOUNTER — Telehealth: Payer: Self-pay | Admitting: *Deleted

## 2024-03-04 ENCOUNTER — Ambulatory Visit (HOSPITAL_COMMUNITY)
Admission: RE | Admit: 2024-03-04 | Discharge: 2024-03-04 | Disposition: A | Discharge status: Home / Self Care | Attending: Gastroenterology | Admitting: Gastroenterology

## 2024-03-04 DIAGNOSIS — K6289 Other specified diseases of anus and rectum: Secondary | ICD-10-CM | POA: Diagnosis not present

## 2024-03-04 DIAGNOSIS — K5730 Diverticulosis of large intestine without perforation or abscess without bleeding: Secondary | ICD-10-CM | POA: Diagnosis not present

## 2024-03-04 DIAGNOSIS — E782 Mixed hyperlipidemia: Secondary | ICD-10-CM | POA: Diagnosis not present

## 2024-03-04 DIAGNOSIS — I10 Essential (primary) hypertension: Secondary | ICD-10-CM | POA: Insufficient documentation

## 2024-03-04 DIAGNOSIS — K219 Gastro-esophageal reflux disease without esophagitis: Secondary | ICD-10-CM | POA: Diagnosis not present

## 2024-03-04 DIAGNOSIS — K629 Disease of anus and rectum, unspecified: Secondary | ICD-10-CM

## 2024-03-04 DIAGNOSIS — Z1211 Encounter for screening for malignant neoplasm of colon: Secondary | ICD-10-CM | POA: Insufficient documentation

## 2024-03-04 HISTORY — PX: COLONOSCOPY: SHX5424

## 2024-03-04 SURGERY — COLONOSCOPY
Anesthesia: General

## 2024-03-04 MED ORDER — PROPOFOL 500 MG/50ML IV EMUL
INTRAVENOUS | Status: DC | PRN
  Administered 2024-03-04: 200 ug/kg/min via INTRAVENOUS
  Administered 2024-03-04: 100 mg via INTRAVENOUS
  Administered 2024-03-04: 30 mg via INTRAVENOUS

## 2024-03-04 MED ORDER — LACTATED RINGERS IV SOLN: INTRAVENOUS | Status: DC

## 2024-03-04 NOTE — Transfer of Care (Signed)
 Immediate Anesthesia Transfer of Care Note  Patient: Charles [name]  Procedure(s) Performed: COLONOSCOPY  Patient Location: Endoscopy Unit  Anesthesia Type:General  Level of Consciousness: awake and alert   Airway & Oxygen Therapy: Patient Spontanous Breathing  Post-op Assessment: Report given to RN and Post -op Vital signs reviewed and stable  Post vital signs: Reviewed and stable  Last Vitals:  Vitals Value Taken Time  BP 92/68 03/04/24 13:35  Temp 36.6 C 03/04/24 13:35  Pulse 81 03/04/24 13:35  Resp 18 03/04/24 13:35  SpO2 97 % 03/04/24 13:35    Last Pain:  Vitals:   03/04/24 1335  TempSrc: Oral  PainSc: 0-No pain      Patients Stated Pain Goal: 5 (03/04/24 1213)  Complications: No notable events documented.

## 2024-03-04 NOTE — Discharge Instructions (Signed)

## 2024-03-04 NOTE — Telephone Encounter (Signed)
 LMOVM to return call.

## 2024-03-04 NOTE — Op Note (Signed)
 [facility] Patient Name: Charles [name] Procedure Date: 03/04/2024 12:23 PM MRN: [id number] Date of Birth: February 09, 1966 Attending MD: Deatrice Dine , MD, [id number] CSN: [id number] Age: 58 Admit Type: Outpatient Procedure:                Colonoscopy Indications:              Screening for colorectal malignant neoplasm Providers:                Deatrice Dine, MD, Madelin Hunter, RN, [place] Wilson,                            Technician Referring MD:              Medicines:                Monitored Anesthesia Care Complications:            No immediate complications. Estimated Blood Loss:     Estimated blood loss: none. Procedure:                Pre-Anesthesia Assessment:                           - Prior to the procedure, a History and Physical                            was performed, and patient medications and                            allergies were reviewed. The patient's tolerance of                            previous anesthesia was also reviewed. The risks                            and benefits of the procedure and the sedation                            options and risks were discussed with the patient.                            All questions were answered, and informed consent                            was obtained. Prior Anticoagulants: The patient has                            taken no anticoagulant or antiplatelet agents. ASA                            Grade Assessment: II - A patient with mild systemic                            disease. After reviewing the risks and benefits,  the patient was deemed in satisfactory condition to                            undergo the procedure.                           After obtaining informed consent, the colonoscope                            was passed under direct vision. Throughout the                            procedure, the patient's blood pressure, pulse, and                            oxygen  saturations were monitored continuously. The                            [id number]-[id number] ([id number]) Colon was introduced through                            the anus and advanced to the the cecum, identified                            by appendiceal orifice and ileocecal valve. The                            colonoscopy was performed without difficulty. The                            patient tolerated the procedure well. The quality                            of the bowel preparation was evaluated using the                            BBPS [facility] Bowel Preparation Scale) with scores                            of: Right Colon = 3, Transverse Colon = 3 and Left                            Colon = 3 (entire mucosa seen well with no residual                            staining, small fragments of stool or opaque                            liquid). The total BBPS score equals 9. The                            ileocecal valve, appendiceal orifice, and rectum  were photographed. Scope In: 1:22:22 PM Scope Out: 1:32:24 PM Scope Withdrawal Time: 0 hours 8 minutes 21 seconds  Total Procedure Duration: 0 hours 10 minutes 2 seconds  Findings:      The perianal and digital rectal examinations were normal.      A few small-mouthed diverticula were found in the left colon.      A localized area of granular mucosa was found in the rectum. Biopsies       were taken with a cold forceps for histology. Impression:               - Diverticulosis in the left colon.                           - Granularity in the rectum. Biopsied. Moderate Sedation:      Per Anesthesia Care Recommendation:           - Patient has a contact number available for                            emergencies. The signs and symptoms of potential                            delayed complications were discussed with the                            patient. Return to normal activities tomorrow.                             Written discharge instructions were provided to the                            patient.                           - Resume previous diet.                           - Continue present medications.                           - Await pathology results.                           - Repeat colonoscopy in 7-10 years for surveillance                            based on pathology results.                           - Return to primary care physician as previously                            scheduled. Procedure Code(s):        --- Professional ---                           [phone], Colonoscopy, flexible; with biopsy, single  or multiple Diagnosis Code(s):        --- Professional ---                           Z12.11, Encounter for screening for malignant                            neoplasm of colon                           K62.89, Other specified diseases of anus and rectum                           K57.30, Diverticulosis of large intestine without                            perforation or abscess without bleeding CPT copyright 2022 American Medical Association. All rights reserved. The codes documented in this report are preliminary and upon coder review may  be revised to meet current compliance requirements. Deatrice Dine, MD Deatrice Dine, MD 03/04/2024 1:35:45 PM This report has been signed electronically. Number of Addenda: 0

## 2024-03-04 NOTE — H&P (Signed)
 Primary Care Physician:  Joesph Annabella HERO, FNP Primary Gastroenterologist:  Dr. Cinderella  Pre-Procedure History & Physical: HPI:  Charles [name] is a 58 y.o. male is here for a colonoscopy for colon cancer screening purposes.  Patient denies any family history of colorectal cancer.  No melena or hematochezia.  No abdominal pain or unintentional weight loss.  No change in bowel habits.  Overall feels well from a GI standpoint.  Past Medical History:  Diagnosis Date   Abdominal pain    Arthritis    Constipation    GERD (gastroesophageal reflux disease)    Headache(784.0)     Past Surgical History:  Procedure Laterality Date   BIOPSY  12/06/2021   Procedure: BIOPSY;  Surgeon: Cindie Carlin POUR, DO;  Location: [facility];  Service: Endoscopy;;   COLONOSCOPY  03/05/2012   DOQ:Dfjoo internal hemorrhoids/Two sessile polyps HYPERPLASTIC   ESOPHAGOGASTRODUODENOSCOPY  03/05/2012   SLF:Non-erosive gastritis (inflammation), negative H.pylori, s/p empiric Savary dilation   ESOPHAGOGASTRODUODENOSCOPY N/A 06/26/2014   Procedure: ESOPHAGOGASTRODUODENOSCOPY (EGD);  Surgeon: Margo LITTIE Haddock, MD;  Location: [facility];  Service: Endoscopy;  Laterality: N/A;  200 - moved to 12:00 - Ginger notified pt   ESOPHAGOGASTRODUODENOSCOPY (EGD) WITH PROPOFOL  N/A 12/06/2021   Procedure: ESOPHAGOGASTRODUODENOSCOPY (EGD) WITH PROPOFOL ;  Surgeon: Cindie Carlin POUR, DO;  Location: [facility];  Service: Endoscopy;  Laterality: N/A;  11:15am   INGUINAL HERNIA REPAIR Right 05/09/2013   Procedure: HERNIA REPAIR INGUINAL ADULT;  Surgeon: Oneil DELENA Budge, MD;  Location: [facility];  Service: General;  Laterality: Right;    Prior to Admission medications   Medication Sig Start Date End Date Taking? Authorizing Provider  amLODipine  (NORVASC ) 5 MG tablet Take 1 tablet (5 mg total) by mouth daily. 11/23/23  Yes Joesph Annabella HERO, FNP  Multiple Vitamins-Minerals (MULTIVITAMIN WITH MINERALS) tablet Take 1 tablet by mouth daily.    Yes [provider]  omeprazole  (PRILOSEC) 40 MG capsule Take 1 capsule (40 mg total) by mouth 2 (two) times daily. 12/23/21  Yes Rudy Josette RAMAN, PA-C  Melaton-Elderb-Vit C-Vit D3-Zn (SLEEP + IMMUNE HEALTH GUMMIES PO) Take by mouth.    [provider]    Allergies as of 02/13/2024   (No Known Allergies)    Family History  Problem Relation Age of Onset   Hypertension Mother    Diabetes Mother    COPD Mother    Arthritis Mother    Stroke Father    Hypertension Father    Hyperlipidemia Father    Asthma Daughter    Colon cancer Neg Hx    Inflammatory bowel disease Neg Hx    Liver disease Neg Hx     Social History   Socioeconomic History   Marital status: Single    Spouse name: Not on file   Number of children: 4   Years of education: 12   Highest education level: 12th grade  Occupational History   Occupation: Animator: [employer]  Tobacco Use   Smoking status: Never   Smokeless tobacco: Never  Vaping Use   Vaping status: Never Used  Substance and Sexual Activity   Alcohol use: Not Currently    Comment: vodka, twice per week, 1 cup in two mixed drinks. (as of 11/11 no etoh in about a month)   Drug use: No   Sexual activity: Yes    Birth control/protection: None  Other Topics Concern   Not on file  Social History  Narrative   Not on file   Social Drivers of Health   Financial Resource Strain: Low Risk  (11/22/2023)   Overall Financial Resource Strain (CARDIA)    Difficulty of Paying Living Expenses: Not hard at all  Food Insecurity: No Food Insecurity (11/22/2023)   Hunger Vital Sign    Worried About Running Out of Food in the Last Year: Never true    Ran Out of Food in the Last Year: Never true  Transportation Needs: No Transportation Needs (11/22/2023)   PRAPARE - Administrator, Civil Service (Medical): No    Lack of Transportation (Non-Medical): No  Physical Activity:  Sufficiently Active (11/22/2023)   Exercise Vital Sign    Days of Exercise per Week: 5 days    Minutes of Exercise per Session: 60 min  Stress: No Stress Concern Present (11/22/2023)   [employer] of Occupational Health - Occupational Stress Questionnaire    Feeling of Stress: Not at all  Social Connections: Moderately Integrated (11/22/2023)   Social Connection and Isolation Panel    Frequency of Communication with Friends and Family: Three times a week    Frequency of Social Gatherings with Friends and Family: Once a week    Attends Religious Services: 1 to 4 times per year    Active Member of [employer] or Organizations: No    Attends Engineer, structural: Not on file    Marital Status: Living with partner  Recent Concern: Social Connections - Moderately Isolated (09/30/2023)   Social Connection and Isolation Panel    Frequency of Communication with Friends and Family: Once a week    Frequency of Social Gatherings with Friends and Family: Never    Attends Religious Services: 1 to 4 times per year    Active Member of [employer] or Organizations: No    Attends Engineer, structural: Not on file    Marital Status: Living with partner  Intimate Partner Violence: Not on file    Review of Systems: See HPI, otherwise negative ROS  Physical Exam: Vital signs in last 24 hours: Temp:  [98.4 F (36.9 C)] 98.4 F (36.9 C) (10/07 1213) Pulse Rate:  [79] 79 (10/07 1213) Resp:  [25] 25 (10/07 1213) BP: (149)/(102) 149/102 (10/07 1213) SpO2:  [97 %] 97 % (10/07 1213) Weight:  [85.7 kg] 85.7 kg (10/07 1213)   General:   Alert,  Well-developed, well-nourished, pleasant and cooperative in NAD Head:  Normocephalic and atraumatic. Eyes:  Sclera clear, no icterus.   Conjunctiva pink. Ears:  Normal auditory acuity. Nose:  No deformity, discharge,  or lesions. Msk:  Symmetrical without gross deformities. Normal posture. Extremities:  Without clubbing or edema. Neurologic:  Alert and   oriented x4;  grossly normal neurologically. Skin:  Intact without significant lesions or rashes. Psych:  Alert and cooperative. Normal mood and affect.  Impression/Plan: Charles [name] is here for a colonoscopy to be performed for colon cancer screening purposes.  The risks of the procedure including infection, bleed, or perforation as well as benefits, limitations, alternatives and imponderables have been reviewed with the patient. Questions have been answered. All parties agreeable.

## 2024-03-04 NOTE — Anesthesia Postprocedure Evaluation (Signed)
 Anesthesia Post Note  Patient: Charles [name]  Procedure(s) Performed: COLONOSCOPY  Patient location during evaluation: PACU Anesthesia Type: General Level of consciousness: awake and alert Pain management: pain level controlled Vital Signs Assessment: post-procedure vital signs reviewed and stable Respiratory status: spontaneous breathing, nonlabored ventilation, respiratory function stable and patient connected to nasal cannula oxygen Cardiovascular status: blood pressure returned to baseline and stable Postop Assessment: no apparent nausea or vomiting Anesthetic complications: no   No notable events documented.   Last Vitals:  Vitals:   03/04/24 1335 03/04/24 1338  BP: 92/68 107/80  Pulse: 81 80  Resp: 18 20  Temp: 36.6 C   SpO2: 97% 98%    Last Pain:  Vitals:   03/04/24 1338  TempSrc:   PainSc: 0-No pain                 Andrea Limes

## 2024-03-04 NOTE — Anesthesia Preprocedure Evaluation (Signed)
 Anesthesia Evaluation  Patient identified by MRN, date of birth, ID band Patient awake    Reviewed: Allergy & Precautions, H&P , NPO status , Patient's Chart, lab work & pertinent test results  Airway Mallampati: I  TM Distance: >3 FB Neck ROM: Full    Dental no notable dental hx.    Pulmonary neg pulmonary ROS   Pulmonary exam normal breath sounds clear to auscultation       Cardiovascular hypertension, Normal cardiovascular exam Rhythm:Regular Rate:Normal     Neuro/Psych  Headaches  negative psych ROS   GI/Hepatic Neg liver ROS,GERD  ,,  Endo/Other  negative endocrine ROS    Renal/GU negative Renal ROS  negative genitourinary   Musculoskeletal  (+) Arthritis ,    Abdominal   Peds negative pediatric ROS (+)  Hematology negative hematology ROS (+)   Anesthesia Other Findings   Reproductive/Obstetrics negative OB ROS                              Anesthesia Physical Anesthesia Plan  ASA: 2  Anesthesia Plan: General   Post-op Pain Management:    Induction: Intravenous  PONV Risk Score and Plan:   Airway Management Planned: Nasal Cannula  Additional Equipment:   Intra-op Plan:   Post-operative Plan:   Informed Consent: I have reviewed the patients History and Physical, chart, labs and discussed the procedure including the risks, benefits and alternatives for the proposed anesthesia with the patient or authorized representative who has indicated his/her understanding and acceptance.     Dental advisory given  Plan Discussed with: CRNA  Anesthesia Plan Comments:         Anesthesia Quick Evaluation

## 2024-03-04 NOTE — Telephone Encounter (Signed)
 Pt informed that provider that was going to do procedure is out sick today. Dr. Cinderella will be taking over and doing his procedure today. Pt verbalized understanding.

## 2024-03-06 LAB — SURGICAL PATHOLOGY

## 2024-03-11 ENCOUNTER — Encounter (HOSPITAL_COMMUNITY): Payer: Self-pay | Admitting: Gastroenterology

## 2024-03-17 ENCOUNTER — Ambulatory Visit (INDEPENDENT_AMBULATORY_CARE_PROVIDER_SITE_OTHER): Payer: Self-pay | Admitting: Gastroenterology

## 2024-03-19 NOTE — Progress Notes (Signed)
 10 yr TCS noted in recall Patient result letter mailed Patient's PCP is on EPIC

## 2024-03-28 ENCOUNTER — Ambulatory Visit: Admitting: Family Medicine

## 2024-03-31 ENCOUNTER — Ambulatory Visit: Admitting: Family Medicine

## 2024-03-31 ENCOUNTER — Encounter: Payer: Self-pay | Admitting: Family Medicine

## 2024-07-02 ENCOUNTER — Ambulatory Visit: Payer: Self-pay | Admitting: Family Medicine

## 2024-07-02 ENCOUNTER — Ambulatory Visit: Admitting: Family Medicine

## 2024-07-02 ENCOUNTER — Encounter: Payer: Self-pay | Admitting: Family Medicine

## 2024-07-02 VITALS — BP 140/80 | HR 60 | Temp 98.2°F | Ht 75.0 in | Wt 185.4 lb

## 2024-07-02 DIAGNOSIS — R82998 Other abnormal findings in urine: Secondary | ICD-10-CM | POA: Diagnosis not present

## 2024-07-02 DIAGNOSIS — M7989 Other specified soft tissue disorders: Secondary | ICD-10-CM | POA: Diagnosis not present

## 2024-07-02 DIAGNOSIS — R350 Frequency of micturition: Secondary | ICD-10-CM

## 2024-07-02 DIAGNOSIS — R10A3 Flank pain, bilateral: Secondary | ICD-10-CM | POA: Diagnosis not present

## 2024-07-02 LAB — URINALYSIS, ROUTINE W REFLEX MICROSCOPIC
Bilirubin, UA: NEGATIVE
Glucose, UA: NEGATIVE
Ketones, UA: NEGATIVE
Leukocytes,UA: NEGATIVE
Nitrite, UA: NEGATIVE
Protein,UA: NEGATIVE
Specific Gravity, UA: 1.01 (ref 1.005–1.030)
Urobilinogen, Ur: 0.2 mg/dL (ref 0.2–1.0)
pH, UA: 7 (ref 5.0–7.5)

## 2024-07-02 LAB — MICROSCOPIC EXAMINATION
Epithelial Cells (non renal): NONE SEEN /HPF (ref 0–10)
WBC, UA: NONE SEEN /HPF (ref 0–5)

## 2024-07-02 NOTE — Progress Notes (Signed)
 "    Subjective:  Patient ID: Charles [name], male    DOB: [birth year], 59 y.o.   MRN: [id number]  Patient Care Team: Charles Annabella HERO, FNP as PCP - General (Family Medicine) Charles Margo CROME, MD (Inactive) (Gastroenterology)   Chief Complaint:  Back Pain (Lower back pain that has been ongoing ) and Urinary Urgency (States it has been ongoing x 1 year )   HPI: Charles [name] is a 59 y.o. male presenting on 07/02/2024 for Back Pain (Lower back pain that has been ongoing ) and Urinary Urgency (States it has been ongoing x 1 year )   Charles [name] is a 59 year old male who presents with lower back pain and urinary symptoms.  He has been experiencing persistent lower back pain, initially attributing it to heavy lifting at work. Despite ceasing such activities, the pain persists and is located in the lower back, above the hip area. He has not taken any medications for the back pain, such as muscle relaxers, Tylenol , or Motrin .  He reports increased urinary frequency and frothy urine, which he noticed recently. His urine is light yellow in color. He has previously consulted a urologist who did not find any issues with his prostate. He has no history of kidney stones and is not aware of any family history of kidney disease.  He mentions swelling in his fingers, which he noticed two days ago, describing them as feeling tight.  No fever, chills, weakness, or confusion.          Relevant past medical, surgical, family, and social history reviewed and updated as indicated.  Allergies and medications reviewed and updated. Data reviewed: Chart in Epic.   Past Medical History:  Diagnosis Date   Abdominal pain    Arthritis    Constipation    GERD (gastroesophageal reflux disease)    Headache(784.0)     Past Surgical History:  Procedure Laterality Date   BIOPSY  12/06/2021   Procedure: BIOPSY;  Surgeon: Cindie Carlin POUR, DO;  Location: [facility];  Service: Endoscopy;;    COLONOSCOPY  03/05/2012   DOQ:Dfjoo internal hemorrhoids/Two sessile polyps HYPERPLASTIC   COLONOSCOPY N/A 03/04/2024   Procedure: COLONOSCOPY;  Surgeon: Cinderella Deatrice FALCON, MD;  Location: [facility];  Service: Endoscopy;  Laterality: N/A;  1:15 pm, asa 2   ESOPHAGOGASTRODUODENOSCOPY  03/05/2012   SLF:Non-erosive gastritis (inflammation), negative H.pylori, s/p empiric Savary dilation   ESOPHAGOGASTRODUODENOSCOPY N/A 06/26/2014   Procedure: ESOPHAGOGASTRODUODENOSCOPY (EGD);  Surgeon: Margo [name] Harvey, MD;  Location: [facility];  Service: Endoscopy;  Laterality: N/A;  200 - moved to 12:00 - Ginger notified pt   ESOPHAGOGASTRODUODENOSCOPY (EGD) WITH PROPOFOL  N/A 12/06/2021   Procedure: ESOPHAGOGASTRODUODENOSCOPY (EGD) WITH PROPOFOL ;  Surgeon: Cindie Carlin POUR, DO;  Location: [facility];  Service: Endoscopy;  Laterality: N/A;  11:15am   INGUINAL HERNIA REPAIR Right 05/09/2013   Procedure: HERNIA REPAIR INGUINAL ADULT;  Surgeon: Oneil DELENA Budge, MD;  Location: [facility];  Service: General;  Laterality: Right;    Social History   Socioeconomic History   Marital status: Single    Spouse name: Not on file   Number of children: 4   Years of education: 12   Highest education level: 12th grade  Occupational History   Occupation: [employer]     Employer: [employer]  Tobacco Use   Smoking status: Never   Smokeless tobacco: Never  Vaping Use   Vaping status: Never Used  Substance and Sexual Activity   Alcohol use: Not Currently    Comment: vodka, twice per week, 1 cup in two mixed drinks. (as of 11/11 no etoh in about a month)   Drug use: No   Sexual activity: Yes    Birth control/protection: None  Other Topics Concern   Not on file  Social History Narrative   Not on file   Social Drivers of Health   Tobacco Use: Low Risk (07/02/2024)   Patient History    Smoking Tobacco Use: Never    Smokeless Tobacco Use: Never    Passive Exposure: Not on file   Financial Resource Strain: Low Risk (03/27/2024)   Overall Financial Resource Strain (CARDIA)    Difficulty of Paying Living Expenses: Not hard at all  Food Insecurity: No Food Insecurity (03/27/2024)   Epic    Worried About Programme Researcher, Broadcasting/film/video in the Last Year: Never true    Ran Out of Food in the Last Year: Never true  Transportation Needs: No Transportation Needs (03/27/2024)   Epic    Lack of Transportation (Medical): No    Lack of Transportation (Non-Medical): No  Physical Activity: Sufficiently Active (03/27/2024)   Exercise Vital Sign    Days of Exercise per Week: 5 days    Minutes of Exercise per Session: 60 min  Stress: No Stress Concern Present (03/27/2024)   [employer] of Occupational Health - Occupational Stress Questionnaire    Feeling of Stress: Not at all  Social Connections: Moderately Integrated (03/27/2024)   Social Connection and Isolation Panel    Frequency of Communication with Friends and Family: Twice a week    Frequency of Social Gatherings with Friends and Family: Once a week    Attends Religious Services: 1 to 4 times per year    Active Member of [employer] or Organizations: No    Attends Engineer, Structural: Not on file    Marital Status: Living with partner  Intimate Partner Violence: Not on file  Depression (PHQ2-9): Low Risk (07/02/2024)   Depression (PHQ2-9)    PHQ-2 Score: 0  Alcohol Screen: Low Risk (03/27/2024)   Alcohol Screen    Last Alcohol Screening Score (AUDIT): 1  Housing: Low Risk (03/27/2024)   Epic    Unable to Pay for Housing in the Last Year: No    Number of Times Moved in the Last Year: 0    Homeless in the Last Year: No  Utilities: Not on file  Health Literacy: Not on file    Outpatient Encounter Medications as of 07/02/2024  Medication Sig   amLODipine  (NORVASC ) 5 MG tablet Take 1 tablet (5 mg total) by mouth daily.   Melaton-Elderb-Vit C-Vit D3-Zn (SLEEP + IMMUNE HEALTH GUMMIES PO) Take by mouth.   Multiple  Vitamins-Minerals (MULTIVITAMIN WITH MINERALS) tablet Take 1 tablet by mouth daily.   omeprazole  (PRILOSEC) 40 MG capsule Take 1 capsule (40 mg total) by mouth 2 (two) times daily.   No facility-administered encounter medications on file as of 07/02/2024.    Allergies[1]  Pertinent ROS per HPI, otherwise unremarkable      Objective:  BP (!) 140/80   Pulse 60   Temp 98.2 F (36.8 C)   Ht 6' 3 (1.905 m)   Wt 185 lb 6.4 oz (84.1 kg)   SpO2 98%   BMI 23.17 kg/m    Wt Readings from Last 3 Encounters:  07/02/24 185 lb 6.4 oz (84.1 kg)  03/04/24 189 lb (85.7 kg)  12/21/23  198 lb 6.4 oz (90 kg)    Physical Exam Vitals and nursing note reviewed.  Constitutional:      General: He is not in acute distress.    Appearance: Normal appearance. He is well-developed, well-groomed and normal weight. He is not ill-appearing, toxic-appearing or diaphoretic.  HENT:     Head: Normocephalic and atraumatic.     Jaw: There is normal jaw occlusion.     Right Ear: Hearing normal.     Left Ear: Hearing normal.     Nose: Nose normal.     Mouth/Throat:     Lips: Pink.     Mouth: Mucous membranes are moist.     Pharynx: Oropharynx is clear. Uvula midline.  Eyes:     General: Lids are normal.     Extraocular Movements: Extraocular movements intact.     Conjunctiva/sclera: Conjunctivae normal.     Pupils: Pupils are equal, round, and reactive to light.  Neck:     Thyroid : No thyroid  mass, thyromegaly or thyroid  tenderness.     Vascular: No carotid bruit or JVD.     Trachea: Trachea and phonation normal.  Cardiovascular:     Rate and Rhythm: Normal rate and regular rhythm.     Chest Wall: PMI is not displaced.     Pulses: Normal pulses.     Heart sounds: Normal heart sounds. No murmur heard.    No friction rub. No gallop.  Pulmonary:     Effort: Pulmonary effort is normal. No respiratory distress.     Breath sounds: Normal breath sounds. No wheezing.  Abdominal:     General: Bowel sounds  are normal. There is no distension or abdominal bruit.     Palpations: Abdomen is soft. There is no hepatomegaly or splenomegaly.     Tenderness: There is no abdominal tenderness. There is no right CVA tenderness or left CVA tenderness.     Hernia: No hernia is present.  Musculoskeletal:        General: Normal range of motion.     Cervical back: Normal range of motion and neck supple.     Right lower leg: No edema.     Left lower leg: No edema.  Lymphadenopathy:     Cervical: No cervical adenopathy.  Skin:    General: Skin is warm and dry.     Capillary Refill: Capillary refill takes less than 2 seconds.     Coloration: Skin is not cyanotic, jaundiced or pale.     Findings: No rash.  Neurological:     General: No focal deficit present.     Mental Status: He is alert and oriented to person, place, and time.     Sensory: Sensation is intact.     Motor: Motor function is intact.     Coordination: Coordination is intact.     Gait: Gait is intact.     Deep Tendon Reflexes: Reflexes are normal and symmetric.  Psychiatric:        Attention and Perception: Attention and perception normal.        Mood and Affect: Mood and affect normal.        Speech: Speech normal.        Behavior: Behavior normal. Behavior is cooperative.        Thought Content: Thought content normal.        Cognition and Memory: Cognition and memory normal.        Judgment: Judgment normal.    Physical Exam   ABDOMEN:  No costovertebral angle tenderness        Results for orders placed or performed in visit on 07/02/24  Microscopic Examination   Collection Time: 07/02/24  3:42 PM   Urine  Result Value Ref Range   WBC, UA None seen 0 - 5 /hpf   RBC, Urine 0-2 0 - 2 /hpf   Epithelial Cells (non renal) None seen 0 - 10 /hpf  Urinalysis, Routine w reflex microscopic   Collection Time: 07/02/24  3:42 PM  Result Value Ref Range   Specific Gravity, UA 1.010 1.005 - 1.030   pH, UA 7.0 5.0 - 7.5   Color, UA  Yellow Yellow   Appearance Ur Clear Clear   Leukocytes,UA Negative Negative   Protein,UA Negative Negative/Trace   Glucose, UA Negative Negative   Ketones, UA Negative Negative   RBC, UA Trace (A) Negative   Bilirubin, UA Negative Negative   Urobilinogen, Ur 0.2 0.2 - 1.0 mg/dL   Nitrite, UA Negative Negative   Microscopic Examination See below:        Pertinent labs & imaging results that were available during my care of the patient were reviewed by me and considered in my medical decision making.  Assessment & Plan:  Charles [name] was seen today for back pain and urinary urgency.  Diagnoses and all orders for this visit:  Bilateral flank pain -     Urine Culture -     Urinalysis, Routine w reflex microscopic -     CMP14+EGFR -     Microscopic Examination  Urinary frequency -     Urine Culture -     Urinalysis, Routine w reflex microscopic -     CMP14+EGFR -     Microscopic Examination  Swelling of both hands -     Urine Culture -     Urinalysis, Routine w reflex microscopic -     CMP14+EGFR -     Microscopic Examination  Frothy urine -     Urine Culture -     Urinalysis, Routine w reflex microscopic -     CMP14+EGFR -     Microscopic Examination        Evaluation of possible renal or urinary tract disorder Reports lower back pain, increased urinary frequency, and frothy urine. No fever, chills, weakness, or confusion. Previous urological evaluation suggested prostate swelling. Differential includes renal or urinary tract disorder versus musculoskeletal pain. Frothy urine may indicate proteinuria, suggesting early kidney issues. Pain location suggests musculoskeletal origin, but renal causes cannot be ruled out without further testing. - Ordered urinalysis to check for abnormalities - Ordered blood work including BUN, creatinine, and GFR to assess renal function - If labs indicate renal issues, will consider ordering an ultrasound of the kidneys - If  musculoskeletal, will recommend naproxen twice a day for a few days  Swelling of both hands Reports swelling and tightness in fingers, noticed two days ago. No associated weakness or confusion. Swelling may be related to renal issues or other systemic causes. - Evaluated renal function as part of the evaluation for possible renal disorder          Continue all other maintenance medications.  Follow up plan: Return if symptoms worsen or fail to improve.   Continue healthy lifestyle choices, including diet (rich in fruits, vegetables, and lean proteins, and low in salt and simple carbohydrates) and exercise (at least 30 minutes of moderate physical activity daily).   The above assessment and management plan was discussed with  the patient. The patient verbalized understanding of and has agreed to the management plan. Patient is aware to call the clinic if they develop any new symptoms or if symptoms persist or worsen. Patient is aware when to return to the clinic for a follow-up visit. Patient educated on when it is appropriate to go to the emergency department.   Rosaline Bruns, FNP-C [facility] [phone]     [1] No Known Allergies  "

## 2024-07-03 LAB — CMP14+EGFR
ALT: 24 [IU]/L (ref 0–44)
AST: 31 [IU]/L (ref 0–40)
Albumin: 4.3 g/dL (ref 3.8–4.9)
Alkaline Phosphatase: 67 [IU]/L (ref 47–123)
BUN/Creatinine Ratio: 8 — ABNORMAL LOW (ref 9–20)
BUN: 8 mg/dL (ref 6–24)
Bilirubin Total: 0.4 mg/dL (ref 0.0–1.2)
CO2: 24 mmol/L (ref 20–29)
Calcium: 8.9 mg/dL (ref 8.7–10.2)
Chloride: 100 mmol/L (ref 96–106)
Creatinine, Ser: 1.03 mg/dL (ref 0.76–1.27)
Globulin, Total: 2.3 g/dL (ref 1.5–4.5)
Glucose: 78 mg/dL (ref 70–99)
Potassium: 4.4 mmol/L (ref 3.5–5.2)
Sodium: 140 mmol/L (ref 134–144)
Total Protein: 6.6 g/dL (ref 6.0–8.5)
eGFR: 85 mL/min/{1.73_m2}

## 2024-07-04 LAB — URINE CULTURE
